# Patient Record
Sex: Female | Born: 1950 | Race: White | Hispanic: No | Smoking: Former smoker
Health system: Southern US, Community
[De-identification: ages and names within clinical notes are randomized; demographics above are authoritative.]

## PROBLEM LIST (undated history)

## (undated) DIAGNOSIS — F419 Anxiety disorder, unspecified: Secondary | ICD-10-CM

## (undated) DIAGNOSIS — E785 Hyperlipidemia, unspecified: Secondary | ICD-10-CM

## (undated) DIAGNOSIS — K219 Gastro-esophageal reflux disease without esophagitis: Secondary | ICD-10-CM

## (undated) HISTORY — DX: Hyperlipidemia, unspecified: E78.5

## (undated) HISTORY — DX: Anxiety disorder, unspecified: F41.9

## (undated) HISTORY — DX: Gastro-esophageal reflux disease without esophagitis: K21.9

## (undated) HISTORY — PX: CATARACT EXTRACTION: SUR2

## (undated) HISTORY — PX: ABDOMINAL HYSTERECTOMY: SHX81

## (undated) HISTORY — PX: BREAST BIOPSY: SHX20

## (undated) HISTORY — PX: OTHER SURGICAL HISTORY: SHX169

## (undated) HISTORY — PX: SHOULDER SURGERY: SHX246

---

## 2018-12-07 ENCOUNTER — Other Ambulatory Visit: Payer: Self-pay | Admitting: Nurse Practitioner

## 2018-12-07 DIAGNOSIS — Z1231 Encounter for screening mammogram for malignant neoplasm of breast: Secondary | ICD-10-CM

## 2019-01-22 ENCOUNTER — Ambulatory Visit
Admission: RE | Admit: 2019-01-22 | Discharge: 2019-01-22 | Disposition: A | Discharge status: Home / Self Care | Payer: Medicare PPO | Source: Ambulatory Visit | Attending: Nurse Practitioner | Admitting: Nurse Practitioner

## 2019-01-22 ENCOUNTER — Other Ambulatory Visit: Payer: Self-pay

## 2019-01-22 DIAGNOSIS — Z1231 Encounter for screening mammogram for malignant neoplasm of breast: Secondary | ICD-10-CM

## 2020-06-18 ENCOUNTER — Ambulatory Visit: Payer: Medicare PPO | Admitting: Neurology

## 2020-06-18 ENCOUNTER — Encounter: Payer: Self-pay | Admitting: Neurology

## 2020-06-18 VITALS — BP 142/84 | HR 86 | Ht 66.0 in | Wt 145.3 lb

## 2020-06-18 DIAGNOSIS — R251 Tremor, unspecified: Secondary | ICD-10-CM | POA: Diagnosis not present

## 2020-06-18 DIAGNOSIS — G250 Essential tremor: Secondary | ICD-10-CM

## 2020-06-18 DIAGNOSIS — R498 Other voice and resonance disorders: Secondary | ICD-10-CM

## 2020-06-18 DIAGNOSIS — R490 Dysphonia: Secondary | ICD-10-CM | POA: Diagnosis not present

## 2020-06-18 MED ORDER — PRIMIDONE 50 MG PO TABS: 100.0000 mg | ORAL_TABLET | Freq: Every day | ORAL | 3 refills | Status: DC

## 2020-06-18 NOTE — Patient Instructions (Addendum)
It was nice to meet you with Belenda Cruise today. 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  It can affect your head, neck, chin, voice, and hands.  It can get worse over time.   I do not see any signs or symptoms of parkinson's like disease or what we call parkinsonism.   Please remember, that any kind of tremor may be exacerbated by anxiety, anger, nervousness, excitement, dehydration, sleep deprivation, thyroid disease, by caffeine, and low blood sugar values or blood sugar fluctuations. Some medications, especially some antidepressants and lithium can cause or exacerbate tremors. Tremors may temporarily calm down or subside with the use of a benzodiazepine medication, such as Valium or related medications and with alcohol. Be aware, however, that drinking alcohol is not an approved or appropriate treatment for tremor control and long-term use of benzodiazepines such as Valium, lorazepam, alprazolam, or clonazepam can cause habit formation, physical and psychological addiction.   As discussed, for symptomatic treatment of your tremor, I suggest Mysoline (primidone) 50 mg strength: Take 1/2 pill each bedtime for 2 weeks, then 1 pill each bedtime for 2 weeks, then 1 1/2 pills each bedtime for 2 weeks, then 2 pills each bedtime thereafter. Common side effects reported are: Sleepiness, drowsiness, balance problems, confusion, and GI related symptoms.  Please send a prescription to your [facility] in [place].  If you are doing well on it, we can change it to your [facility] mail order pharmacy for long-term use.  Please follow-up in 3 to 4 months to see one of our nurse practitioners.  We will do one blood test today to check your thyroid function with the screening test called TSH.  We will call you with the result.

## 2020-06-18 NOTE — Progress Notes (Signed)
Subjective:    Patient ID: Candice Roth is a 70 y.o. female.  HPI     Huston FoleySaima Hazley Dezeeuw, MD, PhD [facility] [address], [address] [address] [place], [place] [id number]  Dear Candice NixonJanice,   I saw your patient, Candice MangoJannece Roth, upon your kind request, in my Neurologic clinic today for initial consultation of her tremor and voice changes, concern for parkinsonism.  The patient is accompanied by her younger daughter, [name], today.  As you know, Ms. Candice Roth is a 70 year old left-handed woman with an underlying medical history of hyperlipidemia, anxiety, reflux disease, and chronic back pain, who reports a several month history of tremors affecting her left more than right hand.  She has noticed a voice tremor in the recent months.  She feels that the tremor is more noticeable when she reaches for something or hold something.  She has noticed the tremor while she is holding onto the steering wheel but has not had any difficulty driving.  She is not good with directions but this is not a new problem.  She has had increase in anxiety.  She has been on sertraline in the recent past but tapered off of it on her own.  She thought the sertraline made her tremor worse.  She has a family history of tremors affecting both grandmothers, her maternal grandmother had a hand tremor and her paternal grandmother had a voice tremor.  No telltale family history of Parkinson's disease.  She does not remember that her mom had a tremor, she lived to be close to 72 years old.  Her father died younger at the age of 72 from cancer.  Father's brother also died younger from an accident.  Patient has 2 sisters, neither 1 has a tremor.  She has 3 children, an older daughter and a set of twins, son and daughter, neither 1 with tremors.  She is separated, [place] and lives with her younger daughter, [name] who is here today, [name]'s husband and their 4 boys.   Patient quit smoking some 3 to 4 years ago, she vapes nicotine.  She drinks alcohol very rarely.  She drinks diet Pepsi but it is caffeine free and no other caffeine generally speaking.  She does not drink a whole lot of water.  She has not fallen.  She has had some right hip pain lately.  She has low back pain issues as well.  She has had some changes in her gait per daughter, sometimes she looks like she is more hunched over.  Sometimes she walks with a shuffle.  She has had difficulty with her handwriting and can still present but has difficulty with cursive.    Her Past Medical History Is Significant For: Past Medical History:  Diagnosis Date  . Anxiety   . GERD (gastroesophageal reflux disease)   . Hyperlipidemia     Her Past Surgical History Is Significant For: Past Surgical History:  Procedure Laterality Date  . ABDOMINAL HYSTERECTOMY    . CATARACT EXTRACTION    . SHOULDER SURGERY Right   . yag treatment left eye      Her Family History Is Significant For: Family History  Problem Relation Age of Onset  . Heart attack Mother   . Heart attack Father   . Cancer Father        Oat cell     Her Social History Is Significant For: Social History   Socioeconomic History  .  Marital status: Legally Separated    Spouse name: Not on file  . Number of children: Not on file  . Years of education: Not on file  . Highest education level: Not on file  Occupational History  . Not on file  Tobacco Use  . Smoking status: Former Games developer  . Smokeless tobacco: Never Used  Substance and Sexual Activity  . Alcohol use: Yes    Comment: Rare  . Drug use: Never  . Sexual activity: Not on file  Other Topics Concern  . Not on file  Social History Narrative  . Not on file   Social Determinants of Health   Financial Resource Strain: Not on file  Food Insecurity: Not on file  Transportation Needs: Not on file  Physical Activity: Not on file  Stress: Not on file  Social Connections: Not  on file    Her Allergies Are:  Allergies  Allergen Reactions  . Shellfish Allergy     Other reaction(s): Shortness of breath and hives  :   Her Current Medications Are:  Outpatient Encounter Medications as of 06/18/2020  Medication Sig  . atorvastatin (LIPITOR) 20 MG tablet Take 20 mg by mouth daily.   No facility-administered encounter medications on file as of 06/18/2020.  :   Review of Systems:  Out of a complete 14 point review of systems, all are reviewed and negative with the exception of these symptoms as listed below:  Review of Systems  Neurological:       Here for consult on tremor and shaky voice. Pt reports hand tremors are mostly in the left (dominate) hand some in the right. Reports sx have became more pronounced lately.     Objective:  Neurological Exam  Physical Exam Physical Examination:   Vitals:   06/18/20 1449  BP: (!) 142/84  Pulse: 86  SpO2: 96%   General Examination: The patient is a very pleasant 70 y.o. female in no acute distress. She appears well-developed and well-nourished and well groomed.   HEENT: Normocephalic, atraumatic, pupils are equal, round and reactive to light and accommodation.  She is status post bilateral cataract repairs.  Hearing is grossly intact, extraocular tracking well preserved, no nystagmus.  Face is symmetric with no significant facial masking noted, speech is mildly dysphonic, she has a voice tremor.  She has an intermittent lower lip and chin tremor.  She has no significant nuchal rigidity and good range of motion in the neck, no carotid bruits.  Airway examination reveals moderate mouth dryness.  Tongue protrudes centrally and palate elevates symmetrically.   Chest: Clear to auscultation without wheezing, rhonchi or crackles noted.  Heart: S1+S2+0, regular and normal without murmurs, rubs or gallops noted.   Abdomen: Soft, non-tender and non-distended with normal bowel sounds appreciated on  auscultation.  Extremities: There is no pitting edema in the distal lower extremities bilaterally.  Skin: Warm and dry without trophic changes noted.  Musculoskeletal: exam reveals right hip discomfort, mild right knee swelling.    Neurologically:  Mental status: The patient is awake, alert and oriented in all 4 spheres. Her immediate and remote memory, attention, language skills and fund of knowledge are appropriate. There is no evidence of aphasia, agnosia, apraxia or anomia. Speech is clear with normal prosody and enunciation. Thought process is linear. Mood is normal and affect is normal.  Cranial nerves II - XII are as described above under HEENT exam. In addition: shoulder shrug is normal with equal shoulder height noted. Motor exam: Normal  bulk, strength and tone is noted. There is no drift, or rebound.  She has a slight and intermittent resting tremor in the left hand, it is a faster and irregular tremor.  She has a mild to moderate left upper extremity postural tremor, mild action tremor, mild postural tremor in the right upper extremity with mild action component, no significant intention tremor in either upper extremity.  On 06/18/2020: On Archimedes spiral drawing she has mild trembling with both hands, more noticeable with the left hand, handwriting with the left hand is tremulous, legible, not micrographic.  Romberg is not tested for safety concerns, reflexes are 1+ in the upper extremities and trace in the lower extremities. Fine motor skills and coordination: intact with normal finger taps, normal hand movements, normal rapid alternating patting, normal foot taps and normal foot agility.  No obvious decrement in amplitude, no lateralization noted.  Cerebellar testing: No dysmetria or intention tremor on finger to nose testing. Heel to shin is unremarkable bilaterally. There is no truncal or gait ataxia.  Sensory exam: intact to light touch in the upper and lower extremities.  Gait,  station and balance: She stands without difficulty, posture is age-appropriate.  She reports some discomfort in her right hip and initially has a slight limp on the right.  She walks with preserved arm swing, no obvious shuffling.  Assessment and Plan:   In summary, Jametta Guidroz is a very pleasant 70 y.o.-year old female with an underlying medical history of hyperlipidemia, anxiety, reflux disease, and chronic back pain, who presents for evaluation of her tremor disorder.  She has developed a hand tremor which is more pronounced in the left and a voice tremor, examination also shows a facial tremor as well as mild dysphonia.  She has no telltale signs of parkinsonism, history and examination are supportive of essential tremor.   I had a long discussion with the patient and her daughter, Belenda Cruise, regarding this diagnosis, its prognosis and potential symptomatic treatment options.  She had blood work through your office on 03/25/2020 which showed a benign CMP and benign lipid panel.  We will add a TSH today.  We talked about tremor triggers including caffeine, anxiety, stress, blood sugar fluctuations, dehydration, she does not drink a whole lot of caffeine but is advised to stay better hydrated with water.  She denies any significant anxiety currently but was on sertraline before.  First-line treatment options would be in the form of primidone or propranolol.  I would recommend a trial of primidone low-dose starting at 50 mg strength with half a pill at bedtime.  We talked about expectations, limitations and possible common side effects including sedation.  Of note, the patient reports that she does not sleep very well.  She has chronic difficulty initiating sleep.  She is advised that this could make her sleepy at night but she should also look out for problems with balance and drowsiness during the day, headaches and GI upset.  She is agreeable to starting this medication.  We will call with the TSH  result and plan a follow-up in this clinic in about 3 to 4 months with one of our nurse practitioners.  We will start with low-dose Mysoline at 50 mg strength half a pill at bedtime and increase in weekly increments to up to 100 mg at bedtime for now.  We can increase further and add a daytime dose if need be in the future.  Alternatively, we could consider propranolol down the road, we would  want to be mindful of exacerbation of depression potentially and also lower heart rate and pulse, lightheadedness and dizziness with the propranolol.   They are advised to call with any interim questions or concerns or email through MyChart.  I answered all their questions today and the patient and her daughter were in agreement.   Of note, her neurological exam is otherwise nonfocal and I do not see a pressing reason to proceed with the scan.  We can always consider brain MRI in the future.  Thank you very much for allowing me to participate in the care of this nice patient. If I can be of any further assistance to you please do not hesitate to call me at [phone].  Sincerely,   Huston Foley, MD, PhD

## 2020-06-19 LAB — TSH: TSH: 1.14 u[IU]/mL (ref 0.450–4.500)

## 2020-06-19 NOTE — Progress Notes (Signed)
Thyroid screening test called TSH was normal, please update pt or daughter.

## 2020-06-22 ENCOUNTER — Telehealth: Payer: Self-pay

## 2020-06-22 NOTE — Telephone Encounter (Signed)
Pt advised of results and verbalized understanding.  

## 2020-06-22 NOTE — Telephone Encounter (Signed)
-----   Message from Huston Foley, MD sent at 06/19/2020 10:45 AM EST ----- Thyroid screening test called TSH was normal, please update pt or daughter.

## 2020-09-14 ENCOUNTER — Other Ambulatory Visit: Payer: Self-pay | Admitting: Neurology

## 2020-09-14 DIAGNOSIS — R251 Tremor, unspecified: Secondary | ICD-10-CM

## 2020-09-14 DIAGNOSIS — R498 Other voice and resonance disorders: Secondary | ICD-10-CM

## 2020-09-14 DIAGNOSIS — G250 Essential tremor: Secondary | ICD-10-CM

## 2020-09-14 DIAGNOSIS — R490 Dysphonia: Secondary | ICD-10-CM

## 2020-09-28 ENCOUNTER — Other Ambulatory Visit: Payer: Self-pay

## 2020-09-28 ENCOUNTER — Ambulatory Visit: Payer: Medicare Other | Admitting: Family Medicine

## 2020-09-28 ENCOUNTER — Encounter: Payer: Self-pay | Admitting: Family Medicine

## 2020-09-28 VITALS — BP 156/78 | HR 97 | Ht 66.0 in | Wt 139.0 lb

## 2020-09-28 DIAGNOSIS — G250 Essential tremor: Secondary | ICD-10-CM

## 2020-09-28 DIAGNOSIS — R498 Other voice and resonance disorders: Secondary | ICD-10-CM | POA: Diagnosis not present

## 2020-09-28 MED ORDER — PROPRANOLOL HCL ER 60 MG PO CP24: 60.0000 mg | ORAL_CAPSULE | Freq: Every day | ORAL | 3 refills | Status: DC

## 2020-09-28 NOTE — Patient Instructions (Signed)
Below is our plan:  We will discontinue primidone as it has not been effective. I recommend weaning this medications as follows: take 1.5 tablets for the next 4-5 nights, then decrease to 1 tablet nightly for 4-5 nights, then half a tablet for 4-5 nights, then discontinue. After discontinuing primidone, start propranolol ER 60mg  every day. You can take it in the morning or evening. Please monitor for side effects as discussed and let me know if you have any trouble!  Please make sure you are staying well hydrated. I recommend 50-60 ounces daily. Well balanced diet and regular exercise encouraged. Consistent sleep schedule with 6-8 hours recommended.   Please continue follow up with care team as directed.   Follow up with me in 3-6 months.   You may receive a survey regarding today's visit. I encourage you to leave honest feed back as I do use this information to improve patient care. Thank you for seeing me today!

## 2020-09-28 NOTE — Progress Notes (Addendum)
Chief Complaint  Patient presents with   Follow-up    Rm 2, alone. Tremor f/u, pt states her tremors have been the same since starting primidone. Pt states some nights she feels a little off balance but goes away in a few minutes.      HISTORY OF PRESENT ILLNESS: 09/28/20 ALL:  Candice Roth is a 69 y.o. female here today for follow up for essential tremor. She was started on primidone at consult visit with Dr Frances Furbish. She was advised to take 25mg  at bedtime and titrate to total dose of 100mg  at bedtime, as tolerated. She has increased dose to 100mg  at bedtime without any significant improvement. She has very mild dizziness from time to time after taking it but otherwise tolerating well. She does not feel tremor has improved at all on this medication. Left hand worse than right. Activity worsens tremor. Voice tremor unchanged.    HISTORY (copied from Dr previous note)  Dear ,   I saw your patient, Candice Roth, upon your kind request, in my Neurologic clinic today for initial consultation of her tremor and voice changes, concern for parkinsonism.  The patient is accompanied by her younger daughter, Teofilo Pod, today.  As you know, Candice Roth is a 70 year old left-handed woman with an underlying medical history of hyperlipidemia, anxiety, reflux disease, and chronic back pain, who reports a several month history of tremors affecting her left more than right hand.  She has noticed a voice tremor in the recent months.  She feels that the tremor is more noticeable when she reaches for something or hold something.  She has noticed the tremor while she is holding onto the steering wheel but has not had any difficulty driving.  She is not good with directions but this is not a new problem.  She has had increase in anxiety.  She has been on sertraline in the recent past but tapered off of it on her own.  She thought the sertraline made her tremor worse.  She has a family history of  tremors affecting both grandmothers, her maternal grandmother had a hand tremor and her paternal grandmother had a voice tremor.  No telltale family history of Parkinson's disease.  She does not remember that her mom had a tremor, she lived to be close to 48 years old.  Her father died younger at the age of 94 from cancer.  Father's brother also died younger from an accident.  Patient has 2 sisters, neither 1 has a tremor.  She has 3 children, an older daughter and a set of twins, son and daughter, neither 1 with tremors.  She is separated, she moved to [place] in October 2019 and lives with her younger daughter, [place] who is here today, [name]'s husband and their 4 boys.  Patient quit smoking some 3 to 4 years ago, she vapes nicotine.  She drinks alcohol very rarely.  She drinks diet Pepsi but it is caffeine free and no other caffeine generally speaking.  She does not drink a whole lot of water.  She has not fallen.  She has had some right hip pain lately.  She has low back pain issues as well.  She has had some changes in her gait per daughter, sometimes she looks like she is more hunched over.  Sometimes she walks with a shuffle.  She has had difficulty with her handwriting and can still present but has difficulty with cursive.     REVIEW OF  SYSTEMS: Out of a complete 14 system review of symptoms, the patient complains only of the following symptoms, hand and voice tremor and all other reviewed systems are negative.    ALLERGIES: Allergies  Allergen Reactions   Shellfish Allergy     Other reaction(s): Shortness of breath and hives     HOME MEDICATIONS: Outpatient Medications Prior to Visit  Medication Sig Dispense Refill   atorvastatin (LIPITOR) 20 MG tablet Take 20 mg by mouth daily.     primidone (MYSOLINE) 50 MG tablet TAKE 2 TABLETS BY MOUTH AT BEDTIME. FOLLOW TITRATION INSTRUCTIONS PROVIDED SEPARATELY. 180 tablet 1   No facility-administered medications prior to  visit.     PAST MEDICAL HISTORY: Past Medical History:  Diagnosis Date   Anxiety    GERD (gastroesophageal reflux disease)    Hyperlipidemia      PAST SURGICAL HISTORY: Past Surgical History:  Procedure Laterality Date   ABDOMINAL HYSTERECTOMY     CATARACT EXTRACTION     SHOULDER SURGERY Right    yag treatment left eye       FAMILY HISTORY: Family History  Problem Relation Age of Onset   Heart attack Mother    Heart attack Father    Cancer Father        Oat cell      SOCIAL HISTORY: Social History   Socioeconomic History   Marital status: Legally Separated    Spouse name: Not on file   Number of children: Not on file   Years of education: Not on file   Highest education level: Not on file  Occupational History   Not on file  Tobacco Use   Smoking status: Former    Pack years: 0.00   Smokeless tobacco: Never  Substance and Sexual Activity   Alcohol use: Yes    Comment: Rare   Drug use: Never   Sexual activity: Not on file  Other Topics Concern   Not on file  Social History Narrative   Not on file   Social Determinants of Health   Financial Resource Strain: Not on file  Food Insecurity: Not on file  Transportation Needs: Not on file  Physical Activity: Not on file  Stress: Not on file  Social Connections: Not on file  Intimate Partner Violence: Not on file      PHYSICAL EXAM  Vitals:   09/28/20 1417  BP: (!) 156/78  Pulse: 97  Weight: 139 lb (63 kg)  Height: 5\' 6"  (1.676 m)   Body mass index is 22.44 kg/m.   Generalized: Well developed, in no acute distress  Cardiology: normal rate and rhythm, no murmur auscultated  Respiratory: clear to auscultation bilaterally    Neurological examination  Mentation: Alert oriented to time, place, history taking. Follows all commands speech and language fluent, mild dysphonia  Cranial nerve II-XII: Pupils were equal round reactive to light. Extraocular movements were full, visual field were  full on confrontational test. Facial sensation and strength were normal.Head turning and shoulder shrug  were normal and symmetric. Motor: The motor testing reveals 5 over 5 strength of all 4 extremities. Good symmetric motor tone is noted throughout. Left hand> right hand tremor. Left hand with tremor at rest but significant worsening with action. No resting tremor noted on right.  Gait and station: Gait is normal.     DIAGNOSTIC DATA (LABS, IMAGING, TESTING) - I reviewed patient records, labs, notes, testing and imaging myself where available.  No results found for: WBC, HGB, HCT, MCV, PLT  No results found for: NA, K, CL, CO2, GLUCOSE, BUN, CREATININE, CALCIUM, PROT, ALBUMIN, AST, ALT, ALKPHOS, BILITOT, GFRNONAA, GFRAA No results found for: CHOL, HDL, LDLCALC, LDLDIRECT, TRIG, CHOLHDL No results found for: WYOV7C No results found for: VITAMINB12 Lab Results  Component Value Date   TSH 1.140 06/18/2020    No flowsheet data found.   No flowsheet data found.   ASSESSMENT AND PLAN  70 y.o. year old female  has a past medical history of Anxiety, GERD (gastroesophageal reflux disease), and Hyperlipidemia. here with     Essential tremor  Voice tremor  Candice Roth has not noted any improvement in hand or voice tremor on primidone 100mg  daily. She wishes to try propranolol instead. We have reviewed possible side effects with propranolol including need for BP and pulse monitoring, worsening depression, fatigue, changes in heart rate or changes in breathing. She will monitor closely and let me know if she has any concerns. I will have her wean primidone by 1/2 tablet every 4-5 days prior to starting propranolol. Once primidone is discontinued, she will take propranolol ER 60mg  daily. She will continue healthy lifestyle habits and stay well hydrated. May consider referral to [facility] for consideration of DBS if no improvement with propranolol. She was given educational materials on tremor and  propranolol in [facility]. She will follow up in 3-6 months, sooner if needed. She verbalizes understanding and agreement with this plan.   No orders of the defined types were placed in this encounter.    Meds ordered this encounter  Medications   propranolol ER (INDERAL LA) 60 MG 24 hr capsule    Sig: Take 1 capsule (60 mg total) by mouth daily.    Dispense:  90 capsule    Refill:  3    Order Specific Question:   Supervising Provider    Answer:   [facility], MSN, FNP-C 09/28/2020, 2:58 PM  [facility] [address], [address] [place], [address] [place] [phone]  I reviewed the above note and documentation by the Nurse Practitioner and agree with the history, exam, assessment and plan as outlined above. I was available for consultation. [id number], MD, PhD [facility])

## 2020-11-19 HISTORY — PX: KNEE ARTHROSCOPY: SUR90

## 2021-03-29 ENCOUNTER — Encounter: Payer: Self-pay | Admitting: Family Medicine

## 2021-03-29 ENCOUNTER — Ambulatory Visit: Payer: Medicare Other | Admitting: Family Medicine

## 2021-03-29 VITALS — BP 120/78 | HR 64 | Ht 65.0 in | Wt 138.0 lb

## 2021-03-29 DIAGNOSIS — R498 Other voice and resonance disorders: Secondary | ICD-10-CM | POA: Diagnosis not present

## 2021-03-29 DIAGNOSIS — G250 Essential tremor: Secondary | ICD-10-CM

## 2021-03-29 MED ORDER — PROPRANOLOL HCL ER 80 MG PO CP24: 80.0000 mg | ORAL_CAPSULE | Freq: Every day | ORAL | 3 refills | Status: DC

## 2021-03-29 NOTE — Progress Notes (Signed)
Chief Complaint  Patient presents with   Follow-up    Pt alone, r, 16. Overall states that things are stable/about the same. No issues/concerns    HISTORY OF PRESENT ILLNESS: 03/29/21 ALL:  Candice Roth for follow up for ET. We switched her from primidone to propranolol at last visit 09/2020. She reports that tremor has improved. She is not able to sign her name and feels it is legible. She continues to have left > right had tremor. Voice is stable, maybe slightly better. She has had more jerking tremors of jaw. She is tolerating propranolol well.   09/28/2020 ALL: Candice Roth is a 70 y.o. female here today for follow up for essential tremor. She was started on primidone at consult visit with Dr Frances Furbish. She was advised to take 25mg  at bedtime and titrate to total dose of 100mg  at bedtime, as tolerated. She has increased dose to 100mg  at bedtime without any significant improvement. She has very mild dizziness from time to time after taking it but otherwise tolerating well. She does not feel tremor has improved at all on this medication. Left hand worse than right. Activity worsens tremor. Voice tremor unchanged.    HISTORY (copied from Dr previous note)  Dear ,   I saw your patient, Candice Roth, upon your kind request, in my Neurologic clinic today for initial consultation of her tremor and voice changes, concern for parkinsonism.  The patient is accompanied by her younger daughter, Candice Roth, today.  As you know, Candice Roth is a 71 year old left-handed woman with an underlying medical history of hyperlipidemia, anxiety, reflux disease, and chronic back pain, who reports a several month history of tremors affecting her left more than right hand.  She has noticed a voice tremor in the recent months.  She feels that the tremor is more noticeable when she reaches for something or hold something.  She has noticed the tremor while she is holding onto the steering wheel  but has not had any difficulty driving.  She is not good with directions but this is not a new problem.  She has had increase in anxiety.  She has been on sertraline in the recent past but tapered off of it on her own.  She thought the sertraline made her tremor worse.  She has a family history of tremors affecting both grandmothers, her maternal grandmother had a hand tremor and her paternal grandmother had a voice tremor.  No telltale family history of Parkinson's disease.  She does not remember that her mom had a tremor, she lived to be close to 20 years old.  Her father died younger at the age of 32 from cancer.  Father's brother also died younger from an accident.  Patient has 2 sisters, neither 1 has a tremor.  She has 3 children, an older daughter and a set of twins, son and daughter, neither 1 with tremors.  She is separated, she moved to [place] from [place] in October 2019 and lives with her younger daughter, Candice Roth who is here today, Candice Roth's husband and their 4 boys.  Patient quit smoking some 3 to 4 years ago, she vapes nicotine.  She drinks alcohol very rarely.  She drinks diet Pepsi but it is caffeine free and no other caffeine generally speaking.  She does not drink a whole lot of water.  She has not fallen.  She has had some right hip pain lately.  She has low back pain issues as well.  She  has had some changes in her gait per daughter, sometimes she looks like she is more hunched over.  Sometimes she walks with a shuffle.  She has had difficulty with her handwriting and can still present but has difficulty with cursive.     REVIEW OF SYSTEMS: Out of a complete 14 system review of symptoms, the patient complains only of the following symptoms, hand and voice tremor and all other reviewed systems are negative.    ALLERGIES: Allergies  Allergen Reactions   Shellfish Allergy     Other reaction(s): Shortness of breath and hives     HOME MEDICATIONS: Outpatient Medications Prior  to Visit  Medication Sig Dispense Refill   atorvastatin (LIPITOR) 20 MG tablet Take 20 mg by mouth daily.     lisinopril (ZESTRIL) 5 MG tablet Take 5 mg by mouth daily.     propranolol ER (INDERAL LA) 60 MG 24 hr capsule Take 1 capsule (60 mg total) by mouth daily. 90 capsule 3   No facility-administered medications prior to visit.     PAST MEDICAL HISTORY: Past Medical History:  Diagnosis Date   Anxiety    GERD (gastroesophageal reflux disease)    Hyperlipidemia      PAST SURGICAL HISTORY: Past Surgical History:  Procedure Laterality Date   ABDOMINAL HYSTERECTOMY     CATARACT EXTRACTION     KNEE ARTHROSCOPY Right 11/19/2020   SHOULDER SURGERY Right    yag treatment left eye       FAMILY HISTORY: Family History  Problem Relation Age of Onset   Heart attack Mother    Heart attack Father    Cancer Father        Oat cell      SOCIAL HISTORY: Social History   Socioeconomic History   Marital status: Legally Separated    Spouse name: Not on file   Number of children: Not on file   Years of education: Not on file   Highest education level: Not on file  Occupational History   Not on file  Tobacco Use   Smoking status: Former   Smokeless tobacco: Never  Substance and Sexual Activity   Alcohol use: Yes    Comment: Rare   Drug use: Never   Sexual activity: Not on file  Other Topics Concern   Not on file  Social History Narrative   Not on file   Social Determinants of Health   Financial Resource Strain: Not on file  Food Insecurity: Not on file  Transportation Needs: Not on file  Physical Activity: Not on file  Stress: Not on file  Social Connections: Not on file  Intimate Partner Violence: Not on file    PHYSICAL EXAM  Vitals:   03/29/21 1434  BP: 120/78  Pulse: 64  Weight: 138 lb (62.6 kg)  Height: 5\' 5"  (1.651 m)    Body mass index is 22.96 kg/m.   Generalized: Well developed, in no acute distress  Cardiology: normal rate and rhythm,  no murmur auscultated  Respiratory: clear to auscultation bilaterally    Neurological examination  Mentation: Alert oriented to time, place, history taking. Follows all commands speech and language fluent, mild dysphonia  Cranial nerve II-XII: Pupils were equal round reactive to light. Extraocular movements were full, visual field were full on confrontational test. Facial sensation and strength were normal.Head turning and shoulder shrug  were normal and symmetric. Motor: The motor testing reveals 5 over 5 strength of all 4 extremities. Good symmetric motor tone is noted  throughout. Left hand> right hand tremor. Left hand with tremor at rest but significant worsening with action. No resting tremor noted on right.  Gait and station: Gait is normal.     DIAGNOSTIC DATA (LABS, IMAGING, TESTING) - I reviewed patient records, labs, notes, testing and imaging myself where available.  No results found for: WBC, HGB, HCT, MCV, PLT No results found for: NA, K, CL, CO2, GLUCOSE, BUN, CREATININE, CALCIUM, PROT, ALBUMIN, AST, ALT, ALKPHOS, BILITOT, GFRNONAA, GFRAA No results found for: CHOL, HDL, LDLCALC, LDLDIRECT, TRIG, CHOLHDL No results found for: ZOXW9U No results found for: VITAMINB12 Lab Results  Component Value Date   TSH 1.140 06/18/2020    No flowsheet data found.   No flowsheet data found.   ASSESSMENT AND PLAN  70 y.o. year old female  has a past medical history of Anxiety, GERD (gastroesophageal reflux disease), and Hyperlipidemia. here with     Essential tremor  Voice tremor  Candice Roth has had improvement of tremor on propranolol. She has noticed more jerking tremors of jaw over the past few weeks. We will increase propranolol to 80mg  daily. We have reviewed possible side effects with propranolol including need for BP and pulse monitoring, worsening depression, fatigue, changes in heart rate or changes in breathing. She will monitor closely and let me know if she has any  concerns. May consider referral to [facility] for consideration of DBS if no improvement with propranolol. She was given educational materials on tremor and propranolol in AVS. She will follow up in 6 months, sooner if needed. She verbalizes understanding and agreement with this plan.   No orders of the defined types were placed in this encounter.    Meds ordered this encounter  Medications   propranolol ER (INDERAL LA) 80 MG 24 hr capsule    Sig: Take 1 capsule (80 mg total) by mouth daily.    Dispense:  90 capsule    Refill:  3    Order Specific Question:   Supervising Provider    Answer:   [facility] Anson Fret     [[id number]], MSN, FNP-C 03/29/2021, 3:03 PM  [facility] [address], [address] [place], [place] [phone]

## 2021-03-29 NOTE — Patient Instructions (Signed)
Below is our plan:  We will increase propranolol to 80mg  daily. Please discontinue 60mg  dosing. Keep a close eye on your BP and pulse. If you have any unwanted side effects please let me know immediately.   Please make sure you are staying well hydrated. I recommend 50-60 ounces daily. Well balanced diet and regular exercise encouraged. Consistent sleep schedule with 6-8 hours recommended.   Please continue follow up with care team as directed.   Follow up with me in 6 - 12 months   You may receive a survey regarding today's visit. I encourage you to leave honest feed back as I do use this information to improve patient care. Thank you for seeing me today!

## 2021-06-03 ENCOUNTER — Encounter: Payer: Self-pay | Admitting: Podiatry

## 2021-06-03 ENCOUNTER — Other Ambulatory Visit: Payer: Self-pay

## 2021-06-03 ENCOUNTER — Ambulatory Visit: Payer: Medicare Other | Admitting: Podiatry

## 2021-06-03 DIAGNOSIS — G5762 Lesion of plantar nerve, left lower limb: Secondary | ICD-10-CM

## 2021-06-03 DIAGNOSIS — B351 Tinea unguium: Secondary | ICD-10-CM | POA: Diagnosis not present

## 2021-06-03 DIAGNOSIS — G5761 Lesion of plantar nerve, right lower limb: Secondary | ICD-10-CM | POA: Diagnosis not present

## 2021-06-03 DIAGNOSIS — G5763 Lesion of plantar nerve, bilateral lower limbs: Secondary | ICD-10-CM | POA: Diagnosis not present

## 2021-06-03 MED ORDER — CICLOPIROX 8 % EX SOLN
Freq: Every day | CUTANEOUS | 0 refills | Status: DC
Start: 2021-06-03 — End: 2021-11-29

## 2021-06-03 MED ORDER — DEXAMETHASONE SODIUM PHOSPHATE 120 MG/30ML IJ SOLN
8.0000 mg | Freq: Once | INTRAMUSCULAR | Status: AC
  Administered 2021-06-03: 8 mg via INTRA_ARTICULAR

## 2021-06-03 NOTE — Progress Notes (Signed)
°  Subjective:  Patient ID: Candice Roth, female    DOB: June 10, 1950,   MRN: [id number]  Chief Complaint  Patient presents with   nerve     having nerve problem and L nail fungus great toe    71 y.o. female presents for concern for left great toe fungus and nerve pain in bilateral 3rd and 4th toes that has been going on for years. Relates it depends on what shoes she wears but relates numbness and tingling in the toes. It is not constant. No other treatments.   . Denies any other pedal complaints. Denies n/v/f/c.   Past Medical History:  Diagnosis Date   Anxiety    GERD (gastroesophageal reflux disease)    Hyperlipidemia     Objective:  Physical Exam: Vascular: DP/PT pulses 2/4 bilateral. CFT <3 seconds. Normal hair growth on digits. No edema.  Skin. No lacerations or abrasions bilateral feet. Left hallux distal nail white thickened and brittle.  Musculoskeletal: MMT 5/5 bilateral lower extremities in DF, PF, Inversion and Eversion. Deceased ROM in DF of ankle joint. Tender with forefoot squeeze and in third interpace of bilateral feet. Positive mulders click.  Neurological: Sensation intact to light touch.   Assessment:   1. Morton's neuroma of left foot   2. 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 Onychomycosis      Plan:  Patient was evaluated and treated and all questions answered. Discussed neuroma and treatment options with patient.  Refused radiographs today.  Injection offered today. Patient in agreement. Procedure below.  Discussed padding and offloading today.  Has tried meloxicam in past with no relief.  Discussed if pain does not improve may consider  MRI for further surgical planning.  Patient to return in 6 weeks or sooner if concerns arise.   Procedure: Injection Tendon/Ligament Discussed alternatives, risks, complications and verbal consent was obtained.  Location: Bilateral third interspace . Skin Prep: Alcohol. Injectate: 1cc 0.5%  marcaine plain, 1 cc dexamethasone.  Disposition: Patient tolerated procedure well. Injection site dressed with a band-aid.  Post-injection care was discussed and return precautions discussed.    Louann Sjogren, DPM

## 2021-07-16 ENCOUNTER — Other Ambulatory Visit: Payer: Self-pay | Admitting: Nurse Practitioner

## 2021-07-16 ENCOUNTER — Ambulatory Visit: Payer: Medicare Other | Admitting: Podiatry

## 2021-07-16 ENCOUNTER — Encounter: Payer: Self-pay | Admitting: Podiatry

## 2021-07-16 DIAGNOSIS — G5762 Lesion of plantar nerve, left lower limb: Secondary | ICD-10-CM

## 2021-07-16 DIAGNOSIS — B351 Tinea unguium: Secondary | ICD-10-CM

## 2021-07-16 DIAGNOSIS — Z1231 Encounter for screening mammogram for malignant neoplasm of breast: Secondary | ICD-10-CM

## 2021-07-16 DIAGNOSIS — G5761 Lesion of plantar nerve, right lower limb: Secondary | ICD-10-CM

## 2021-07-16 NOTE — Progress Notes (Signed)
?  Subjective:  ?Patient ID: Candice Roth, female    DOB: 1951-03-03,   MRN: [id number] ? ?Chief Complaint  ?Patient presents with  ? Neuroma  ?   b/l neuroma . ?  ? ? ?71 y.o. female presents for follow-up of bilateral neuromas. Relates the right is doing better but the left is still painful mostly at night. Also relates she has not noticed a difference with the penlac yet. .   . Denies any other pedal complaints. Denies n/v/f/c.  ? ?Past Medical History:  ?Diagnosis Date  ? Anxiety   ? GERD (gastroesophageal reflux disease)   ? Hyperlipidemia   ? ? ?Objective:  ?Physical Exam: ?Vascular: DP/PT pulses 2/4 bilateral. CFT <3 seconds. Normal hair growth on digits. No edema.  ?Skin. No lacerations or abrasions bilateral feet. Left hallux distal nail white thickened and brittle.  ?Musculoskeletal: MMT 5/5 bilateral lower extremities in DF, PF, Inversion and Eversion. Deceased ROM in DF of ankle joint. Tender with forefoot squeeze and in third interpace of bilateral feet. Improved on right.  Positive mulders click.  ?Neurological: Sensation intact to light touch.  ? ?Assessment:  ? ?1. Morton's neuroma of left foot   ?2. 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 Onychomycosis   ? ? ? ? ?Plan:  ?Patient was evaluated and treated and all questions answered. ?Discussed neuroma and treatment options with patient.  ?Refused radiographs today.  ?Injection offered today. Defers injection today.  ?Discussed padding and offloading today.  ?Has tried meloxicam in past with no relief.  ?Discussed if pain does not improve may consider  MRI for further surgical planning.  ?Continue with penlac for fungal nails ?Patient to return as needed.  ? ? ?Louann Sjogren, DPM  ? ? ?

## 2021-08-26 ENCOUNTER — Ambulatory Visit (INDEPENDENT_AMBULATORY_CARE_PROVIDER_SITE_OTHER): Payer: Medicare Other

## 2021-08-26 DIAGNOSIS — Z1231 Encounter for screening mammogram for malignant neoplasm of breast: Secondary | ICD-10-CM

## 2021-08-27 ENCOUNTER — Other Ambulatory Visit: Payer: Self-pay | Admitting: Nurse Practitioner

## 2021-08-27 DIAGNOSIS — R928 Other abnormal and inconclusive findings on diagnostic imaging of breast: Secondary | ICD-10-CM

## 2021-09-09 ENCOUNTER — Ambulatory Visit
Admission: RE | Admit: 2021-09-09 | Discharge: 2021-09-09 | Disposition: A | Discharge status: Home / Self Care | Payer: Medicare Other | Source: Ambulatory Visit | Attending: Nurse Practitioner | Admitting: Nurse Practitioner

## 2021-09-09 ENCOUNTER — Other Ambulatory Visit: Payer: Self-pay | Admitting: Nurse Practitioner

## 2021-09-09 DIAGNOSIS — R928 Other abnormal and inconclusive findings on diagnostic imaging of breast: Secondary | ICD-10-CM

## 2021-09-09 DIAGNOSIS — N6310 Unspecified lump in the right breast, unspecified quadrant: Secondary | ICD-10-CM

## 2021-09-21 ENCOUNTER — Ambulatory Visit
Admission: RE | Admit: 2021-09-21 | Discharge: 2021-09-21 | Disposition: A | Discharge status: Home / Self Care | Payer: Medicare Other | Source: Ambulatory Visit | Attending: Nurse Practitioner | Admitting: Nurse Practitioner

## 2021-09-21 DIAGNOSIS — N6310 Unspecified lump in the right breast, unspecified quadrant: Secondary | ICD-10-CM

## 2021-11-25 NOTE — Progress Notes (Signed)
Chief Complaint  Patient presents with   Follow-up    Rm 16, alone. Here to f/u for tremors. Per pt no changes in sx since last ov.     HISTORY OF PRESENT ILLNESS:  11/29/21 ALL:  Candice Roth returns for follow up for ET. We increased propranolol to 80mg  daily at last visit 03/2021. She has not noted any improvement in tremor since. She is tolerating well. She continues to note left > right hand, voice and jaw tremor. Worse when writing or concentrating.   03/29/2021 ALL: Candice Roth returns for follow up for ET. We switched her from primidone to propranolol at last visit 09/2020. She reports that tremor has improved. She is not able to sign her name and feels it is legible. She continues to have left > right had tremor. Voice is stable, maybe slightly better. She has had more jerking tremors of jaw. She is tolerating propranolol well.   09/28/2020 ALL: Candice Roth is a 71 y.o. female here today for follow up for essential tremor. She was started on primidone at consult visit with Dr 61. She was advised to take 25mg  at bedtime and titrate to total dose of 100mg  at bedtime, as tolerated. She has increased dose to 100mg  at bedtime without any significant improvement. She has very mild dizziness from time to time after taking it but otherwise tolerating well. She does not feel tremor has improved at all on this medication. Left hand worse than right. Activity worsens tremor. Voice tremor unchanged.    HISTORY (copied from Dr Frances Furbish previous note)  Dear ,   I saw your patient, Candice Roth, upon your kind request, in my Neurologic clinic today for initial consultation of her tremor and voice changes, concern for parkinsonism.  The patient is accompanied by her younger daughter, , today.  As you know, [name] is a 71 year old left-handed woman with an underlying medical history of hyperlipidemia, anxiety, reflux disease, and chronic back pain, who reports a several  month history of tremors affecting her left more than right hand.  She has noticed a voice tremor in the recent months.  She feels that the tremor is more noticeable when she reaches for something or hold something.  She has noticed the tremor while she is holding onto the steering wheel but has not had any difficulty driving.  She is not good with directions but this is not a new problem.  She has had increase in anxiety.  She has been on sertraline in the recent past but tapered off of it on her own.  She thought the sertraline made her tremor worse.  She has a family history of tremors affecting both grandmothers, her maternal grandmother had a hand tremor and her paternal grandmother had a voice tremor.  No telltale family history of Parkinson's disease.  She does not remember that her mom had a tremor, she lived to be close to 61 years old.  Her father died younger at the age of 70 from cancer.  Father's brother also died younger from an accident.  Patient has 2 sisters, neither 1 has a tremor.  She has 3 children, an older daughter and a set of twins, son and daughter, neither 1 with tremors.  She is separated, she moved to [name] from [address] in October 2019 and lives with her younger daughter, 57 who is here today, [name]'s husband and their 4 boys.  Patient quit smoking some 3 to 4 years ago, she vapes nicotine.  She drinks alcohol very rarely.  She drinks diet Pepsi but it is caffeine free and no other caffeine generally speaking.  She does not drink a whole lot of water.  She has not fallen.  She has had some right hip pain lately.  She has low back pain issues as well.  She has had some changes in her gait per daughter, sometimes she looks like she is more hunched over.  Sometimes she walks with a shuffle.  She has had difficulty with her handwriting and can still present but has difficulty with cursive.     REVIEW OF SYSTEMS: Out of a complete 14 system review of symptoms, the patient  complains only of the following symptoms, hand and voice tremor and all other reviewed systems are negative.   ALLERGIES: Allergies  Allergen Reactions   Shellfish Allergy     Other reaction(s): Shortness of breath and hives     HOME MEDICATIONS: Outpatient Medications Prior to Visit  Medication Sig Dispense Refill   atorvastatin (LIPITOR) 20 MG tablet Take 20 mg by mouth daily.     lisinopril (ZESTRIL) 5 MG tablet Take 5 mg by mouth daily.     propranolol ER (INDERAL LA) 80 MG 24 hr capsule Take 1 capsule (80 mg total) by mouth daily. 90 capsule 3   ciclopirox (PENLAC) 8 % solution Apply topically at bedtime. Apply over nail and surrounding skin. Apply daily over previous coat. After seven (7) days, may remove with alcohol and continue cycle. 6.6 mL 0   No facility-administered medications prior to visit.     PAST MEDICAL HISTORY: Past Medical History:  Diagnosis Date   Anxiety    GERD (gastroesophageal reflux disease)    Hyperlipidemia      PAST SURGICAL HISTORY: Past Surgical History:  Procedure Laterality Date   ABDOMINAL HYSTERECTOMY     CATARACT EXTRACTION     KNEE ARTHROSCOPY Right 11/19/2020   SHOULDER SURGERY Right    yag treatment left eye       FAMILY HISTORY: Family History  Problem Relation Age of Onset   Heart attack Mother    Heart attack Father    Cancer Father        Oat cell      SOCIAL HISTORY: Social History   Socioeconomic History   Marital status: Legally Separated    Spouse name: Not on file   Number of children: Not on file   Years of education: Not on file   Highest education level: Not on file  Occupational History   Not on file  Tobacco Use   Smoking status: Former   Smokeless tobacco: Never  Substance and Sexual Activity   Alcohol use: Yes    Comment: Rare   Drug use: Never   Sexual activity: Not on file  Other Topics Concern   Not on file  Social History Narrative   Not on file   Social Determinants of Health    Financial Resource Strain: Not on file  Food Insecurity: Not on file  Transportation Needs: Not on file  Physical Activity: Not on file  Stress: Not on file  Social Connections: Not on file  Intimate Partner Violence: Not on file    PHYSICAL EXAM  Vitals:   11/29/21 1046  BP: 124/79  Pulse: 65  Weight: 137 lb 12.8 oz (62.5 kg)  Height: 5\' 5"  (1.651 m)     Body mass index is 22.93 kg/m.   Generalized: Well developed, in no acute distress  Cardiology: normal rate and rhythm, no murmur auscultated  Respiratory: clear to auscultation bilaterally    Neurological examination  Mentation: Alert oriented to time, place, history taking. Follows all commands speech and language fluent, mild dysphonia  Cranial nerve II-XII: Pupils were equal round reactive to light. Extraocular movements were full, visual field were full on confrontational test. Facial sensation and strength were normal.Head turning and shoulder shrug  were normal and symmetric. Motor: The motor testing reveals 5 over 5 strength of all 4 extremities. Good symmetric motor tone is noted throughout. Left hand> right hand tremor. Left hand with tremor at rest but significant worsening with action. No resting tremor noted on right.  Gait and station: Gait is normal.     DIAGNOSTIC DATA (LABS, IMAGING, TESTING) - I reviewed patient records, labs, notes, testing and imaging myself where available.  No results found for: "WBC", "HGB", "HCT", "MCV", "PLT" No results found for: "NA", "K", "CL", "CO2", "GLUCOSE", "BUN", "CREATININE", "CALCIUM", "PROT", "ALBUMIN", "AST", "ALT", "ALKPHOS", "BILITOT", "GFRNONAA", "GFRAA" No results found for: "CHOL", "HDL", "LDLCALC", "LDLDIRECT", "TRIG", "CHOLHDL" No results found for: "HGBA1C" No results found for: "VITAMINB12" Lab Results  Component Value Date   TSH 1.140 06/18/2020        No data to display               No data to display           ASSESSMENT AND  PLAN  71 y.o. year old female  has a past medical history of Anxiety, GERD (gastroesophageal reflux disease), and Hyperlipidemia. here with     Essential tremor  Voice tremor  Candice Roth has had improvement of tremor on propranolol. She has noticed more jerking tremors of jaw over the past few weeks. We will continue propranolol LA 80mg  daily. I will add topiramate 50mg  daily. She will start 25mg  daily for 2-3 weeks then increase dose to 50mg  daily. We have reviewed possible side effects of topiramate. May consider referral to [facility] for consideration of DBS if no improvement. She was given educational materials on tremor and propranolol in AVS. She will follow up in 6 months, sooner if needed. She verbalizes understanding and agreement with this plan.   No orders of the defined types were placed in this encounter.     Meds ordered this encounter  Medications   topiramate (TOPAMAX) 50 MG tablet    Sig: Take 1 tablet (50 mg total) by mouth 2 (two) times daily.    Dispense:  90 tablet    Refill:  1    Order Specific Question:   Supervising Provider    Answer:    , MSN, FNP-C 11/29/2021, 12:17 PM  [facility] [address] [place], [[id number]]     LZJ QBHAL 12/01/2021 [phone]

## 2021-11-25 NOTE — Patient Instructions (Signed)
Below is our plan:  We will continue propranolol LA 80mg  daily. Add topiramate 25mg  (1/2 tablet) daily for 2-3 weeks. If well tolerated increase dose to 50mg  daily. Monitor for about 4 weeks. If doing well and you note benefit with tremor, we can increase dose to 100mg  daily. We can wean propranolol in the future if you wish.   Please make sure you are staying well hydrated. I recommend 50-60 ounces daily. Well balanced diet and regular exercise encouraged. Consistent sleep schedule with 6-8 hours recommended.   Please continue follow up with care team as directed.   Follow up with me in 6 months   You may receive a survey regarding today's visit. I encourage you to leave honest feed back as I do use this information to improve patient care. Thank you for seeing me today!

## 2021-11-29 ENCOUNTER — Ambulatory Visit: Payer: Medicare Other | Admitting: Family Medicine

## 2021-11-29 ENCOUNTER — Encounter: Payer: Self-pay | Admitting: Family Medicine

## 2021-11-29 VITALS — BP 124/79 | HR 65 | Ht 65.0 in | Wt 137.8 lb

## 2021-11-29 DIAGNOSIS — G250 Essential tremor: Secondary | ICD-10-CM | POA: Diagnosis not present

## 2021-11-29 DIAGNOSIS — R498 Other voice and resonance disorders: Secondary | ICD-10-CM

## 2021-11-29 MED ORDER — TOPIRAMATE 50 MG PO TABS: 50.0000 mg | ORAL_TABLET | Freq: Two times a day (BID) | ORAL | 1 refills | Status: DC

## 2022-03-28 ENCOUNTER — Other Ambulatory Visit: Payer: Self-pay

## 2022-03-28 MED ORDER — PROPRANOLOL HCL ER 80 MG PO CP24: 80.0000 mg | ORAL_CAPSULE | Freq: Every day | ORAL | 3 refills | Status: DC

## 2022-06-09 NOTE — Patient Instructions (Signed)
Below is our plan:  We will continue propranolol LA '80mg'$  daily. We will add gabapentin '100mg'$  three times daily. Start '100mg'$  daily for 1 week then increase dose to '100mg'$  twice daily for 1 week then increase to '100mg'$  three times daily.   Consider referral to [facility] as discussed.   Please make sure you are staying well hydrated. I recommend 50-60 ounces daily. Well balanced diet and regular exercise encouraged. Consistent sleep schedule with 6-8 hours recommended.   Please continue follow up with care team as directed.   Follow up with me in 6 months   You may receive a survey regarding today's visit. I encourage you to leave honest feed back as I do use this information to improve patient care. Thank you for seeing me today!

## 2022-06-09 NOTE — Progress Notes (Signed)
Chief Complaint  Patient presents with   Follow-up    Pt in room 2 here for follow up tremors. Pt tremors are the stable,days varies on worsen tremors.     HISTORY OF PRESENT ILLNESS:  06/14/22 ALL:  Candice Roth returns for follow up for ET. Last seen 11/2021. We continued propranolol LA '80mg'$  daily and added topiramate '50mg'$  daily. She reports after taking topiramate for about three days, she had really bad headaches. She continued for about three weeks then discontinued. Headaches improved once stopped. Tremor is about the same. She continues to note most difficulty with writing. She used her non dominant hand to hold dominant hand when writing. She has to be careful when eating. She continues to have voice tremor. She also notes chronic neuropathy. BP is usually well managed. She is tolerating propranolol well.   11/29/2021 ALL: Candice Roth returns for follow up for ET. We increased propranolol to '80mg'$  daily at last visit 03/2021. She has not noted any improvement in tremor since. She is tolerating well. She continues to note left > right hand, voice and jaw tremor. Worse when writing or concentrating.   03/29/2021 ALL: Candice Roth returns for follow up for ET. We switched her from primidone to propranolol at last visit 09/2020. She reports that tremor has improved. She is not able to sign her name and feels it is legible. She continues to have left > right had tremor. Voice is stable, maybe slightly better. She has had more jerking tremors of jaw. She is tolerating propranolol well.   09/28/2020 ALL: Candice Roth is a 72 y.o. female here today for follow up for essential tremor. She was started on primidone at consult visit with Dr Rexene Alberts. She was advised to take '25mg'$  at bedtime and titrate to total dose of '100mg'$  at bedtime, as tolerated. She has increased dose to '100mg'$  at bedtime without any significant improvement. She has very mild dizziness from time to time after taking it but otherwise tolerating  well. She does not feel tremor has improved at all on this medication. Left hand worse than right. Activity worsens tremor. Voice tremor unchanged.    HISTORY (copied from Dr Guadelupe Sabin previous note)  Dear Thayer Headings,   I saw your patient, Candice Roth, upon your kind request, in my Neurologic clinic today for initial consultation of her tremor and voice changes, concern for parkinsonism.  The patient is accompanied by her younger daughter, Erasmo Downer, today.  As you know, [name] is a 72 year old left-handed woman with an underlying medical history of hyperlipidemia, anxiety, reflux disease, and chronic back pain, who reports a several month history of tremors affecting her left more than right hand.  She has noticed a voice tremor in the recent months.  She feels that the tremor is more noticeable when she reaches for something or hold something.  She has noticed the tremor while she is holding onto the steering wheel but has not had any difficulty driving.  She is not good with directions but this is not a new problem.  She has had increase in anxiety.  She has been on sertraline in the recent past but tapered off of it on her own.  She thought the sertraline made her tremor worse.  She has a family history of tremors affecting both grandmothers, her maternal grandmother had a hand tremor and her paternal grandmother had a voice tremor.  No telltale family history of Parkinson's disease.  She does not remember that her mom had a tremor,  she lived to be close to 27 years old.  Her father died younger at the age of 29 from cancer.  Father's brother also died younger from an accident.  Patient has 2 sisters, neither 1 has a tremor.  She has 3 children, an older daughter and a set of twins, son and daughter, neither 1 with tremors.  She is separated, she moved to [place] from [place] in October 2019 and lives with her younger daughter, Erasmo Downer who is here today, [name]'s husband and their 4 boys.   Patient quit smoking some 3 to 4 years ago, she vapes nicotine.  She drinks alcohol very rarely.  She drinks diet Pepsi but it is caffeine free and no other caffeine generally speaking.  She does not drink a whole lot of water.  She has not fallen.  She has had some right hip pain lately.  She has low back pain issues as well.  She has had some changes in her gait per daughter, sometimes she looks like she is more hunched over.  Sometimes she walks with a shuffle.  She has had difficulty with her handwriting and can still present but has difficulty with cursive.     REVIEW OF SYSTEMS: Out of a complete 14 system review of symptoms, the patient complains only of the following symptoms, hand and voice tremor and all other reviewed systems are negative.   ALLERGIES: Allergies  Allergen Reactions   Shellfish Allergy     Other reaction(s): Shortness of breath and hives     HOME MEDICATIONS: Outpatient Medications Prior to Visit  Medication Sig Dispense Refill   atorvastatin (LIPITOR) 20 MG tablet Take 20 mg by mouth daily.     lisinopril (ZESTRIL) 5 MG tablet Take 5 mg by mouth daily.     propranolol ER (INDERAL LA) 80 MG 24 hr capsule Take 1 capsule (80 mg total) by mouth daily. 90 capsule 3   topiramate (TOPAMAX) 50 MG tablet Take 1 tablet (50 mg total) by mouth 2 (two) times daily. (Patient not taking: Reported on 06/14/2022) 90 tablet 1   No facility-administered medications prior to visit.     PAST MEDICAL HISTORY: Past Medical History:  Diagnosis Date   Anxiety    GERD (gastroesophageal reflux disease)    Hyperlipidemia      PAST SURGICAL HISTORY: Past Surgical History:  Procedure Laterality Date   ABDOMINAL HYSTERECTOMY     CATARACT EXTRACTION     KNEE ARTHROSCOPY Right 11/19/2020   SHOULDER SURGERY Right    yag treatment left eye       FAMILY HISTORY: Family History  Problem Relation Age of Onset   Heart attack Mother    Heart attack Father    Cancer Father         Oat cell      SOCIAL HISTORY: Social History   Socioeconomic History   Marital status: Legally Separated    Spouse name: Not on file   Number of children: Not on file   Years of education: Not on file   Highest education level: Not on file  Occupational History   Not on file  Tobacco Use   Smoking status: Former   Smokeless tobacco: Never  Substance and Sexual Activity   Alcohol use: Yes    Comment: Rare   Drug use: Never   Sexual activity: Not on file  Other Topics Concern   Not on file  Social History Narrative   Not on file   Social  Determinants of Health   Financial Resource Strain: Not on file  Food Insecurity: Not on file  Transportation Needs: Not on file  Physical Activity: Not on file  Stress: Not on file  Social Connections: Not on file  Intimate Partner Violence: Not on file    PHYSICAL EXAM  Vitals:   06/14/22 1114  BP: 111/73  Pulse: 65  Weight: 138 lb (62.6 kg)  Height: '5\' 5"'$  (1.651 m)      Body mass index is 22.96 kg/m.   Generalized: Well developed, in no acute distress  Cardiology: normal rate and rhythm, no murmur auscultated  Respiratory: clear to auscultation bilaterally    Neurological examination  Mentation: Alert oriented to time, place, history taking. Follows all commands speech and language fluent, mild dysphonia  Cranial nerve II-XII: Pupils were equal round reactive to light. Extraocular movements were full, visual field were full on confrontational test. Facial sensation and strength were normal.Head turning and shoulder shrug  were normal and symmetric. Motor: The motor testing reveals 5 over 5 strength of all 4 extremities. Good symmetric motor tone is noted throughout. Left hand> right hand tremor. Left hand with tremor at rest but significant worsening with action. No resting tremor noted on right.  Gait and station: Gait is normal.     DIAGNOSTIC DATA (LABS, IMAGING, TESTING) - I reviewed patient records, labs,  notes, testing and imaging myself where available.  No results found for: "WBC", "HGB", "HCT", "MCV", "PLT" No results found for: "NA", "K", "CL", "CO2", "GLUCOSE", "BUN", "CREATININE", "CALCIUM", "PROT", "ALBUMIN", "AST", "ALT", "ALKPHOS", "BILITOT", "GFRNONAA", "GFRAA" No results found for: "CHOL", "HDL", "LDLCALC", "LDLDIRECT", "TRIG", "CHOLHDL" No results found for: "HGBA1C" No results found for: "VITAMINB12" Lab Results  Component Value Date   TSH 1.140 06/18/2020        No data to display               No data to display           ASSESSMENT AND PLAN  72 y.o. year old female  has a past medical history of Anxiety, GERD (gastroesophageal reflux disease), and Hyperlipidemia. here with     Essential tremor  Voice tremor  Tanja continues to note left > right hand and voice tremor. We will continue propranolol LA '80mg'$  daily. I will add gabapentin starting with '100mg'$  daily and titrating dose to '100mg'$  TID as tolerated. Possible side effects reviewed. May consider referral to [facility] for consideration of DBS if no improvement. She was given educational materials on tremor and propranolol in AVS. She will follow up in 6 months, sooner if needed. She verbalizes understanding and agreement with this plan.   No orders of the defined types were placed in this encounter.     Meds ordered this encounter  Medications   gabapentin (NEURONTIN) 100 MG capsule    Sig: Take 1 capsule (100 mg total) by mouth 3 (three) times daily. Start '100mg'$  daily for 1 week then increase dose to '100mg'$  twice daily for 1 week then increase to '100mg'$  three times daily.    Dispense:  270 capsule    Refill:  1    Order Specific Question:   Supervising Provider    Answer:   Melvenia Beam [id number], MSN, FNP-C 06/14/2022, 12:15 PM  [facility] [address], [place], [place] [address] [phone]

## 2022-06-14 ENCOUNTER — Encounter: Payer: Self-pay | Admitting: Family Medicine

## 2022-06-14 ENCOUNTER — Ambulatory Visit: Payer: Medicare Other | Admitting: Family Medicine

## 2022-06-14 VITALS — BP 111/73 | HR 65 | Ht 65.0 in | Wt 138.0 lb

## 2022-06-14 DIAGNOSIS — G250 Essential tremor: Secondary | ICD-10-CM

## 2022-06-14 DIAGNOSIS — R498 Other voice and resonance disorders: Secondary | ICD-10-CM

## 2022-06-14 MED ORDER — GABAPENTIN 100 MG PO CAPS: 100.0000 mg | ORAL_CAPSULE | Freq: Three times a day (TID) | ORAL | 1 refills | Status: DC

## 2022-08-29 ENCOUNTER — Other Ambulatory Visit: Payer: Self-pay | Admitting: Family Medicine

## 2022-08-29 DIAGNOSIS — Z780 Asymptomatic menopausal state: Secondary | ICD-10-CM

## 2022-09-08 IMAGING — US US  BREAST BX W/ LOC DEV 1ST LESION IMG BX SPEC US GUIDE*R*
1 series · 11 of 11 positions shown · non-contrast
Comparison: None Available.
COMPARISON: None Available.

Addendum:
CLINICAL DATA: 70-year-old female presenting for ultrasound-guided
biopsy of a right breast mass.

EXAM:
ULTRASOUND GUIDED RIGHT BREAST CORE NEEDLE BIOPSY

[Series 1: us breast bx w/ loc dev 1st lesion img bx spec us  · 0.05mm/px · 11 of 11 slices shown]
[im 1/11]
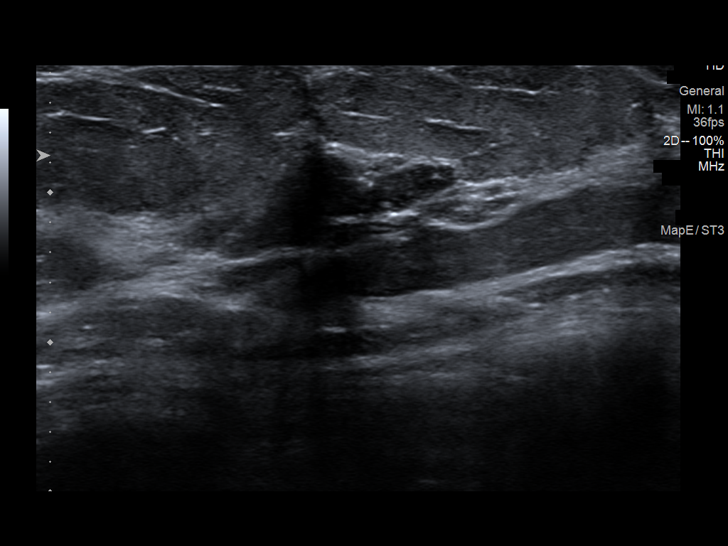
[im 2/11]
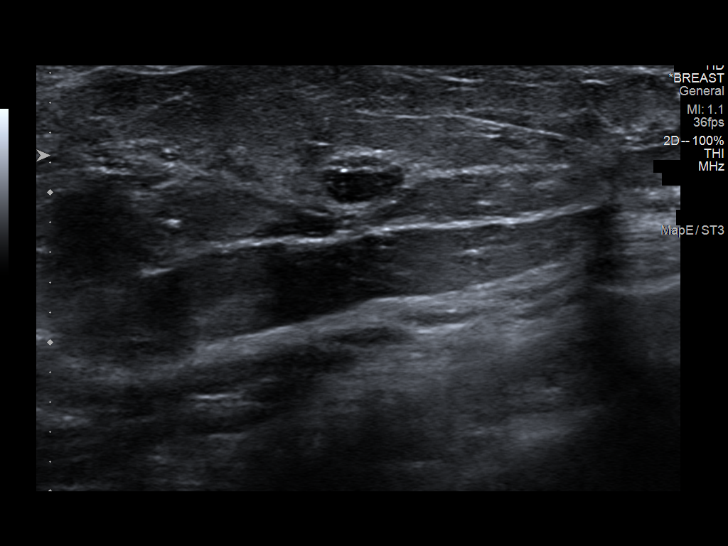
[im 3/11]
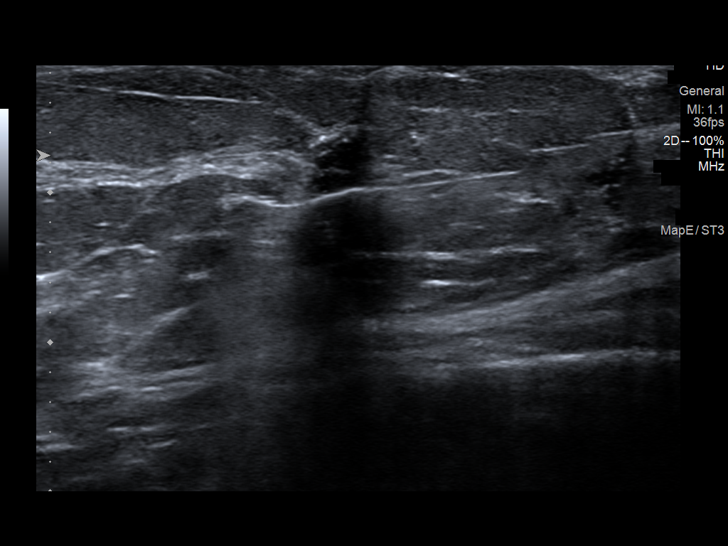
[im 4/11]
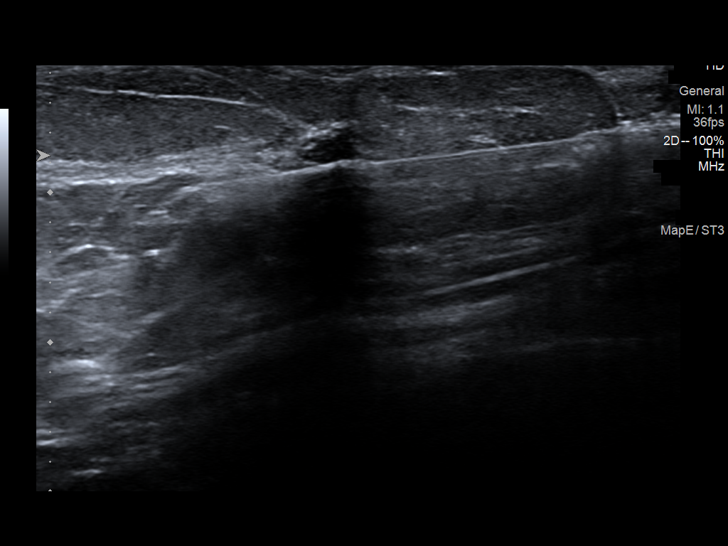
[im 5/11]
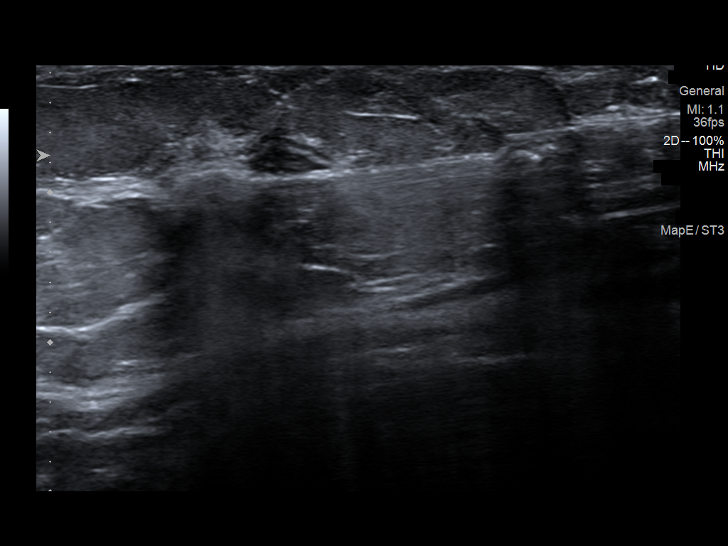
[im 6/11]
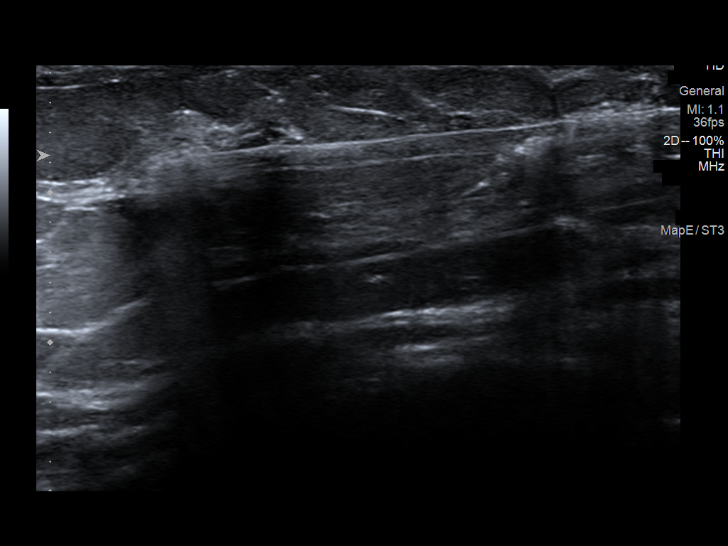
[im 7/11]
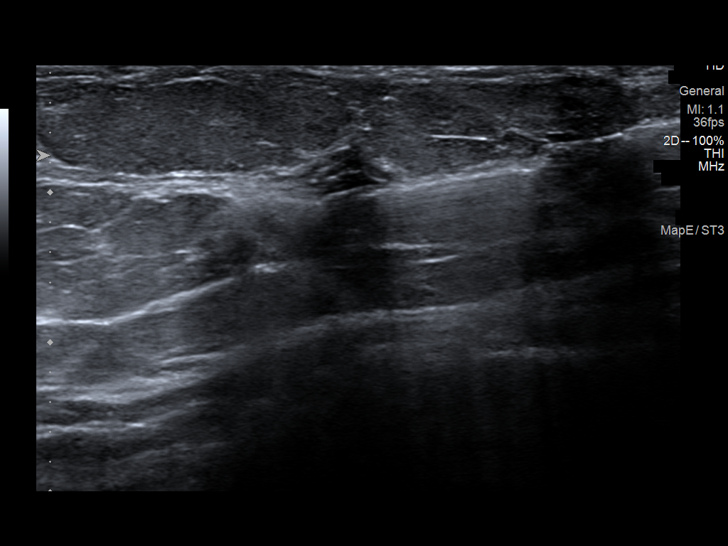
[im 8/11]
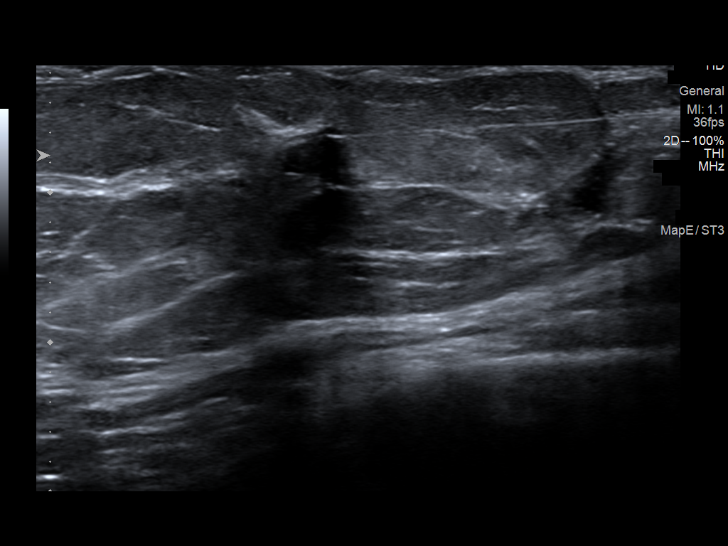
[im 9/11]
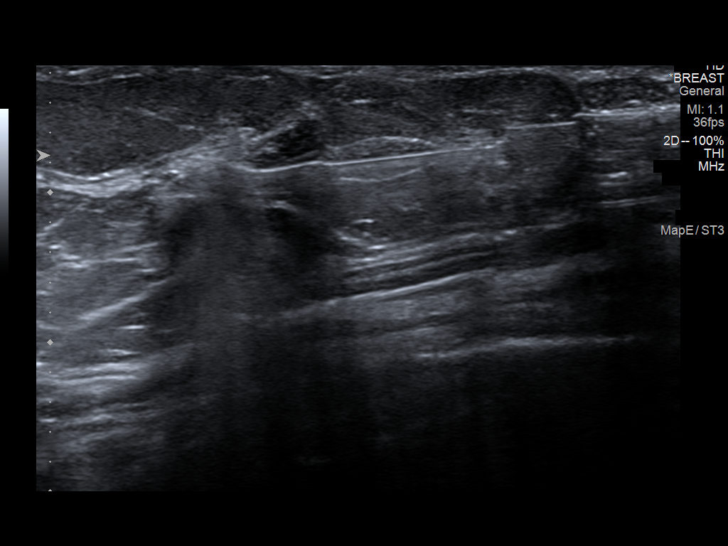
[im 10/11]
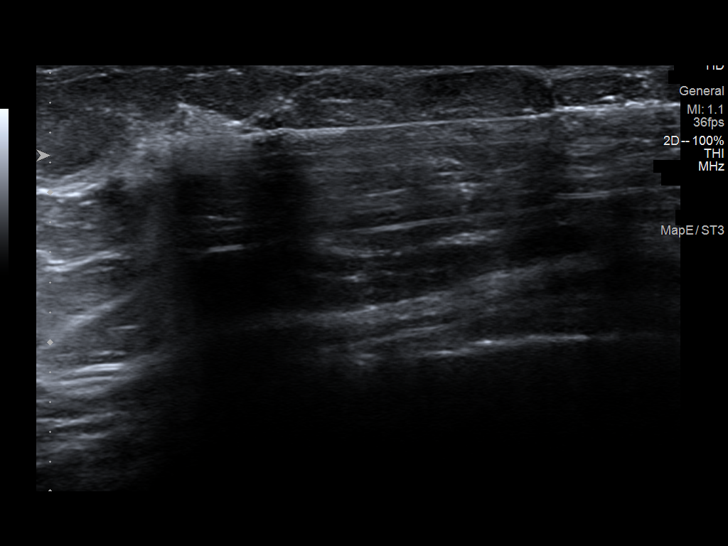
[im 11/11]
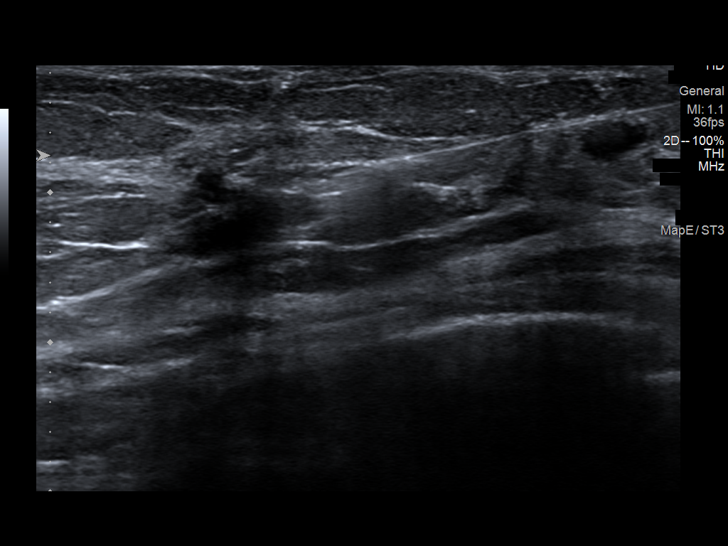

[11 of 11 positions shown; findings below may reference images not displayed]



Lesion quadrant: Upper outer quadrant

Using sterile technique and 1% Lidocaine as local anesthetic, under
direct ultrasound visualization, a 14 gauge Avong device was
used to perform biopsy of a mass in the right breast at 9 o'clock, 5
cm from the nipple using a lateral approach. At the conclusion of
the procedure a ribbon shaped tissue marker clip was deployed into
the biopsy cavity. Follow up 2 view mammogram was performed and
dictated separately.
IMPRESSION: Ultrasound guided biopsy of a right breast mass at 9 o'clock. No
apparent complications.

ADDENDUM:
Pathology revealed FIBROADENOMA of the RIGHT breast, 9 o'clock,
7cmfn (ribbon clip). This was found to be concordant by Dr. Billings
Akewusola.

Pathology results were discussed with the patient by telephone. The
patient reported doing well after the biopsy with tenderness and
bruising at the site. Post biopsy instructions and care were
reviewed and questions were answered. The patient was encouraged to
call The [REDACTED] for any additional
concerns. My direct phone number was provided.

The patient was instructed to return for annual screening

Pathology results reported by Zdeinik Heyabe, RN on 09/23/2021.



Lesion quadrant: Upper outer quadrant

Using sterile technique and 1% Lidocaine as local anesthetic, under
direct ultrasound visualization, a 14 gauge Avong device was
used to perform biopsy of a mass in the right breast at 9 o'clock, 5
cm from the nipple using a lateral approach. At the conclusion of
the procedure a ribbon shaped tissue marker clip was deployed into
the biopsy cavity. Follow up 2 view mammogram was performed and
dictated separately.
IMPRESSION: Ultrasound guided biopsy of a right breast mass at 9 o'clock. No
apparent complications.

## 2022-09-08 IMAGING — MG MM BREAST LOCALIZATION CLIP
4 series · 4 of 12 positions shown · non-contrast
Comparison: Previous exam(s).

CLINICAL DATA: Post biopsy mammogram of the right breast for clip
placement.

EXAM:
3D DIAGNOSTIC RIGHT MAMMOGRAM POST ULTRASOUND BIOPSY

[R CC synth-2D]
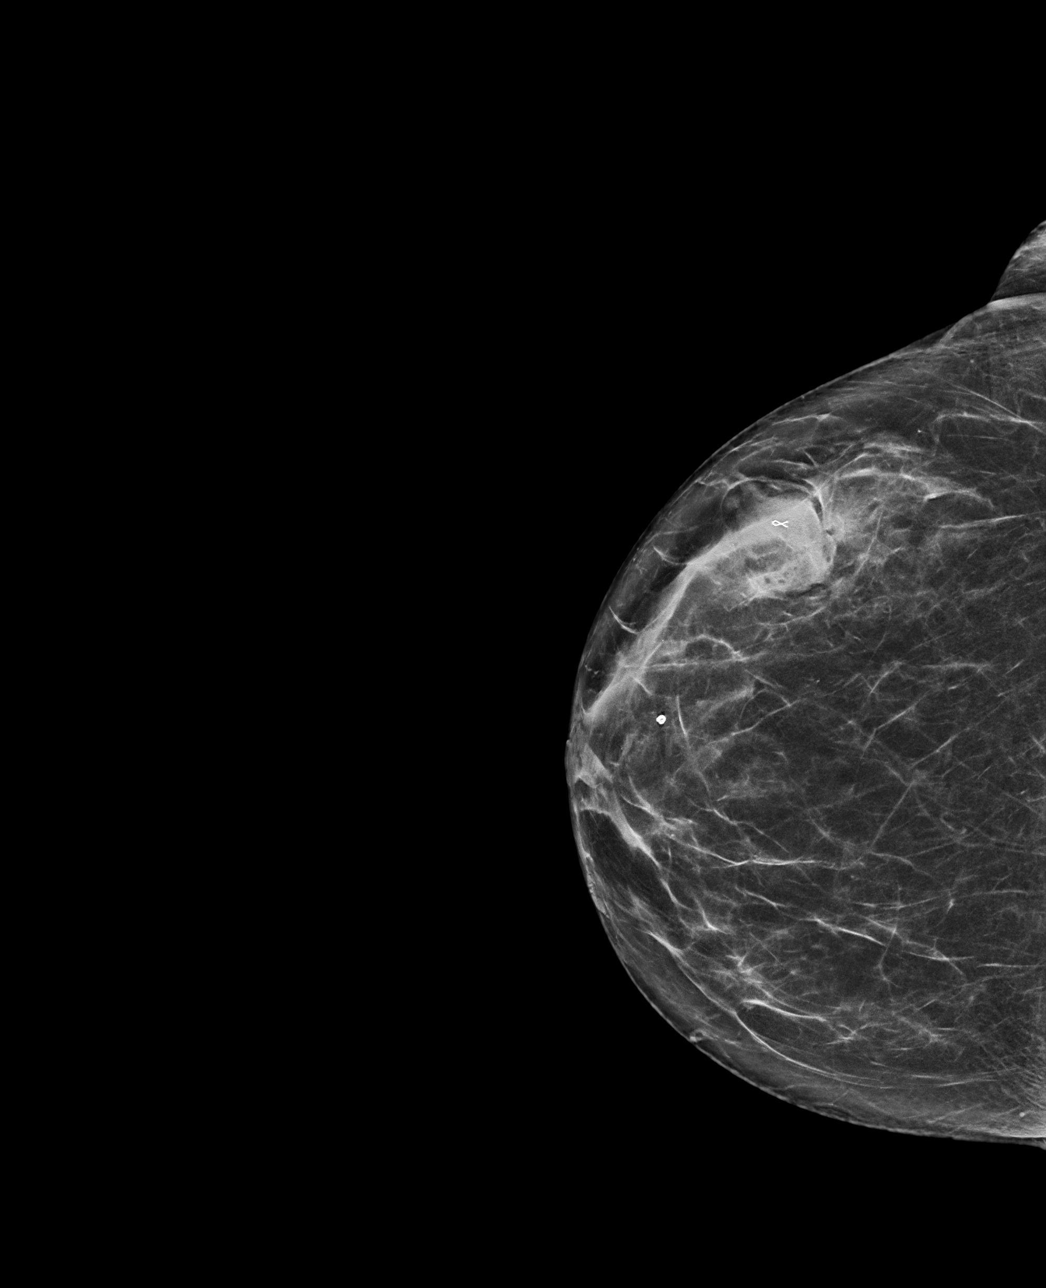

[R ML synth-2D]
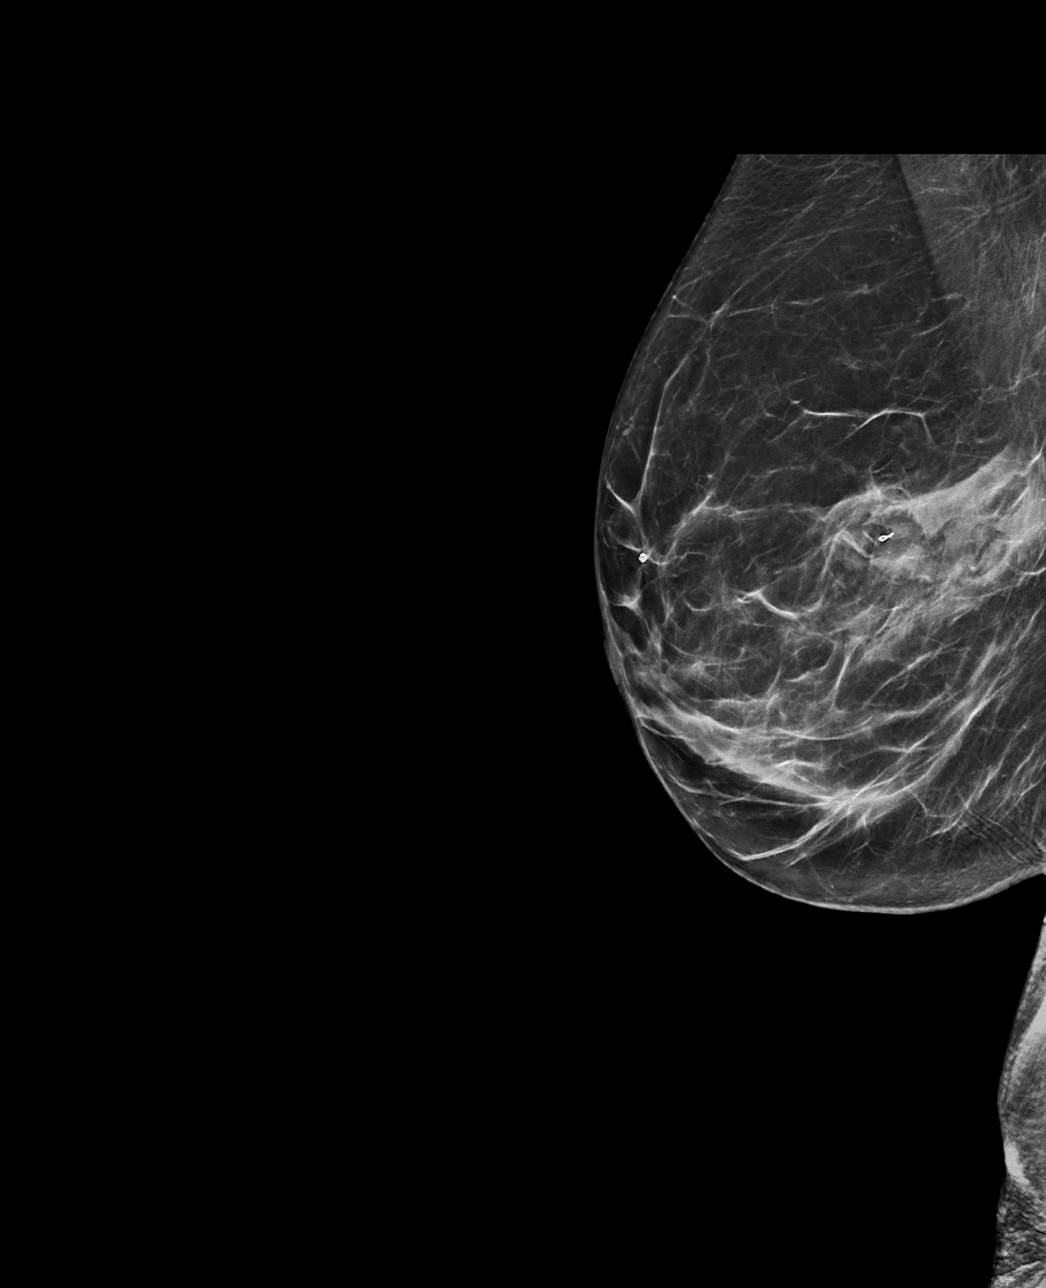

[R CC tomo · tomo slice 35/70.0]
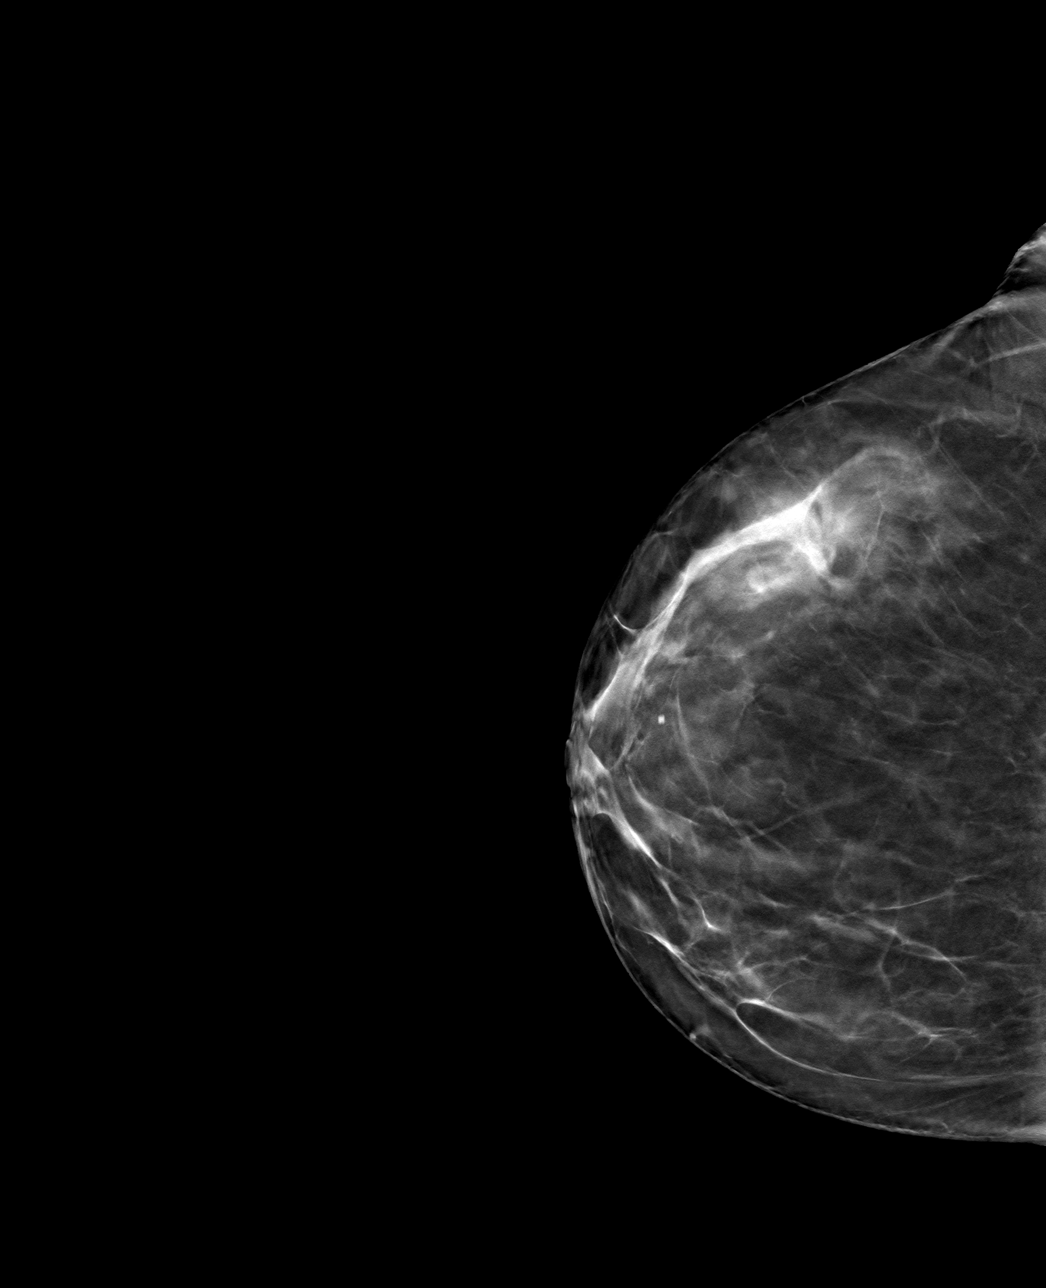

[R ML tomo · tomo slice 36/71.0]
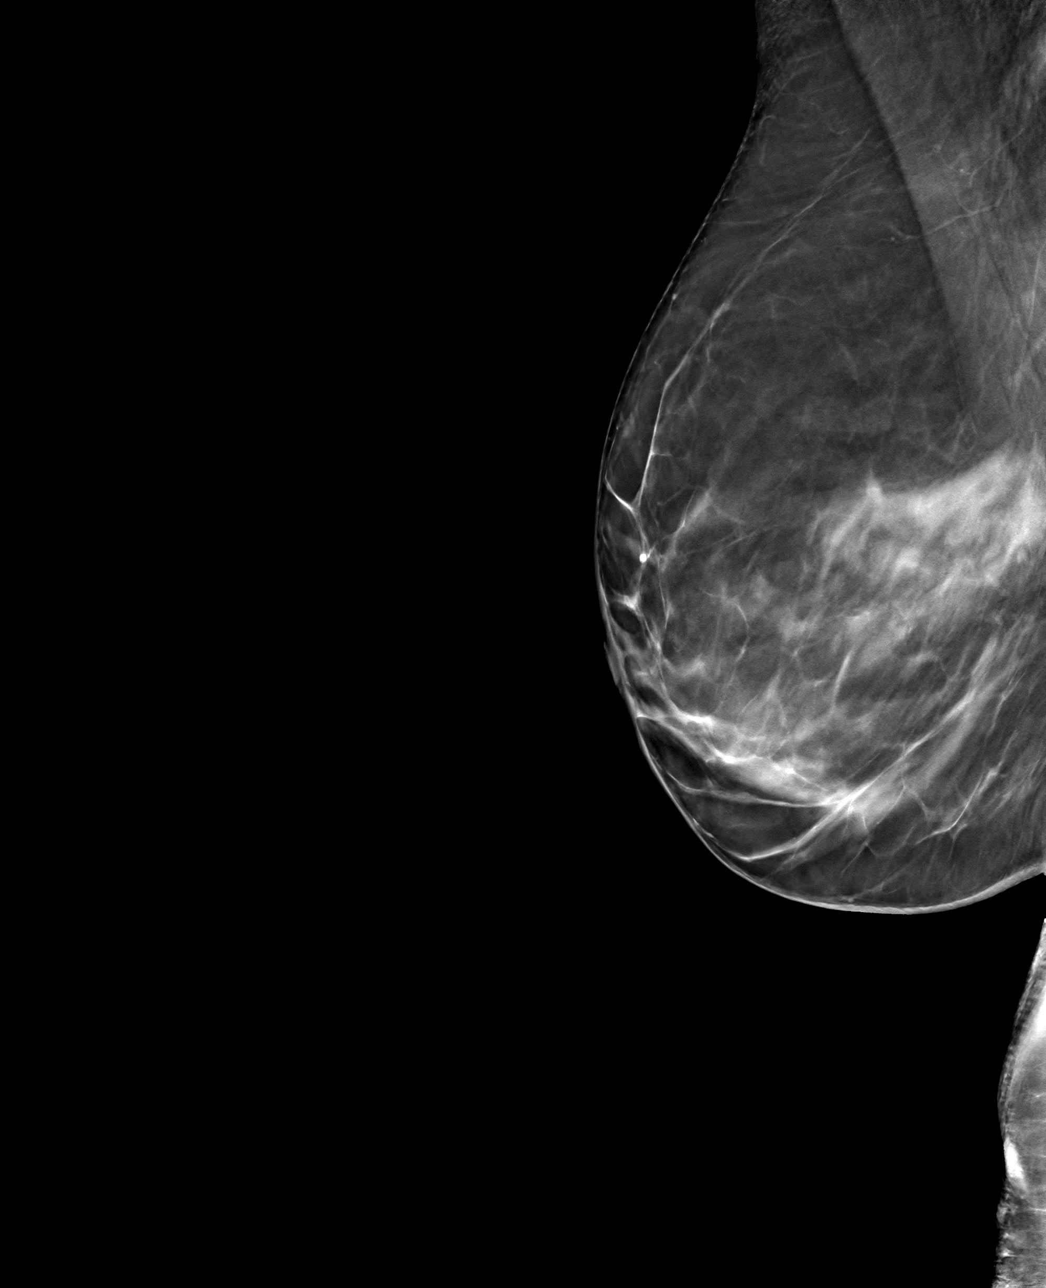

[4 of 12 positions shown; findings below may reference images not displayed]

FINDINGS: 3D Mammographic images were obtained following ultrasound guided
biopsy of a mass in the right breast at 9 o'clock. The biopsy
marking clip is in expected position at the site of biopsy. There is
a 2.5 cm hematoma at the biopsy site.
IMPRESSION: 1. Appropriate positioning of the ribbon shaped biopsy marking clip
at the site of biopsy in the lateral right breast.

2.  There is a 2.5 cm hematoma at the biopsy site.

Final Assessment: Post Procedure Mammograms for Marker Placement

## 2022-09-20 ENCOUNTER — Other Ambulatory Visit: Payer: Self-pay | Admitting: Family Medicine

## 2022-09-20 DIAGNOSIS — Z1231 Encounter for screening mammogram for malignant neoplasm of breast: Secondary | ICD-10-CM

## 2022-10-26 ENCOUNTER — Ambulatory Visit: Payer: Medicare Other

## 2022-10-26 DIAGNOSIS — Z780 Asymptomatic menopausal state: Secondary | ICD-10-CM | POA: Diagnosis not present

## 2022-10-26 DIAGNOSIS — Z1231 Encounter for screening mammogram for malignant neoplasm of breast: Secondary | ICD-10-CM | POA: Diagnosis not present

## 2022-11-03 ENCOUNTER — Encounter: Payer: Self-pay | Admitting: Family Medicine

## 2022-11-30 NOTE — Progress Notes (Signed)
Chief Complaint  Patient presents with   Room 1    Pt is here Alone. Pt states no change in her tremors. Pt states that the Gabapentin isn't working. Pt states that she hasn't been dropping things lately. Pt is weaning herself off of the Gabapentin.     HISTORY OF PRESENT ILLNESS:  12/06/22 ALL:  Candice Roth returns for follow up for ET. She was last seen 05/2022 and reported worsening tremor. We continued propranolol and added gabapentin. She does not feel it has been helpful. She titrated to 100mg  TID. She felt more off balanced with increased dose. She has recently weaned back to 100mg  BID and would like to stop this medication. She continues to tolerate propranolol. Unsure of normal BP readings. Last 11/72 05/2022. Today 137/93. She does not check at home. She failed topiramate d/t headaches. Primidone 100mg  was not effective. Tremor is fairly stable. Most of left hand and voice. She has difficulty writing but able to complete ADLs independently. No trouble eating. No difficulty swallowing. No balance difficulty when not taking gabapentin. No family history of PD.   06/14/2022 ALL: Candice Roth returns for follow up for ET. Last seen 11/2021. We continued propranolol LA 80mg  daily and added topiramate 50mg  daily. She reports after taking topiramate for about three days, she had really bad headaches. She continued for about three weeks then discontinued. Headaches improved once stopped. Tremor is about the same. She continues to note most difficulty with writing. She used her non dominant hand to hold dominant hand when writing. She has to be careful when eating. She continues to have voice tremor. She also notes chronic neuropathy. BP is usually well managed. She is tolerating propranolol well.   11/29/2021 ALL: Candice Roth returns for follow up for ET. We increased propranolol to 80mg  daily at last visit 03/2021. She has not noted any improvement in tremor since. She is tolerating well. She continues to note  left > right hand, voice and jaw tremor. Worse when writing or concentrating.   03/29/2021 ALL: Candice Roth returns for follow up for ET. We switched her from primidone to propranolol at last visit 09/2020. She reports that tremor has improved. She is not able to sign her name and feels it is legible. She continues to have left > right had tremor. Voice is stable, maybe slightly better. She has had more jerking tremors of jaw. She is tolerating propranolol well.   09/28/2020 ALL: Candice Roth is a 72 y.o. female here today for follow up for essential tremor. She was started on primidone at consult visit with Dr Frances Furbish. She was advised to take 25mg  at bedtime and titrate to total dose of 100mg  at bedtime, as tolerated. She has increased dose to 100mg  at bedtime without any significant improvement. She has very mild dizziness from time to time after taking it but otherwise tolerating well. She does not feel tremor has improved at all on this medication. Left hand worse than right. Activity worsens tremor. Voice tremor unchanged.    HISTORY (copied from Dr Teofilo Pod previous note)  Dear Candice Roth,   I saw your patient, Candice Roth, upon your kind request, in my Neurologic clinic today for initial consultation of her tremor and voice changes, concern for parkinsonism.  The patient is accompanied by her younger daughter, Candice Roth, today.  As you know, Candice Roth is a 72 year old left-handed woman with an underlying medical history of hyperlipidemia, anxiety, reflux disease, and chronic back pain, who reports a several month history of tremors  affecting her left more than right hand.  She has noticed a voice tremor in the recent months.  She feels that the tremor is more noticeable when she reaches for something or hold something.  She has noticed the tremor while she is holding onto the steering wheel but has not had any difficulty driving.  She is not good with directions but this is not a new problem.   She has had increase in anxiety.  She has been on sertraline in the recent past but tapered off of it on her own.  She thought the sertraline made her tremor worse.  She has a family history of tremors affecting both grandmothers, her maternal grandmother had a hand tremor and her paternal grandmother had a voice tremor.  No telltale family history of Parkinson's disease.  She does not remember that her mom had a tremor, she lived to be close to 25 years old.  Her father died younger at the age of 72 from cancer.  Father's brother also died younger from an accident.  Patient has 2 sisters, neither 1 has a tremor.  She has 3 children, an older daughter and a set of twins, son and daughter, neither 1 with tremors.  She is separated, she moved to [place] from [place] in October 2019 and lives with her younger daughter, Candice Roth who is here today, Candice Roth's husband and their 4 boys.  Patient quit smoking some 3 to 4 years ago, she vapes nicotine.  She drinks alcohol very rarely.  She drinks diet Pepsi but it is caffeine free and no other caffeine generally speaking.  She does not drink a whole lot of water.  She has not fallen.  She has had some right hip pain lately.  She has low back pain issues as well.  She has had some changes in her gait per daughter, sometimes she looks like she is more hunched over.  Sometimes she walks with a shuffle.  She has had difficulty with her handwriting and can still present but has difficulty with cursive.     REVIEW OF SYSTEMS: Out of a complete 14 system review of symptoms, the patient complains only of the following symptoms, hand and voice tremor and all other reviewed systems are negative.   ALLERGIES: Allergies  Allergen Reactions   Shellfish Allergy     Other reaction(s): Shortness of breath and hives     HOME MEDICATIONS: Outpatient Medications Prior to Visit  Medication Sig Dispense Refill   atorvastatin (LIPITOR) 20 MG tablet Take 20 mg by mouth  daily.     lisinopril (ZESTRIL) 5 MG tablet Take 5 mg by mouth daily.     propranolol ER (INDERAL LA) 80 MG 24 hr capsule Take 1 capsule (80 mg total) by mouth daily. 90 capsule 3   gabapentin (NEURONTIN) 100 MG capsule Take 1 capsule (100 mg total) by mouth 3 (three) times daily. Start 100mg  daily for 1 week then increase dose to 100mg  twice daily for 1 week then increase to 100mg  three times daily. (Patient not taking: Reported on 12/06/2022) 270 capsule 1   No facility-administered medications prior to visit.     PAST MEDICAL HISTORY: Past Medical History:  Diagnosis Date   Anxiety    GERD (gastroesophageal reflux disease)    Hyperlipidemia      PAST SURGICAL HISTORY: Past Surgical History:  Procedure Laterality Date   ABDOMINAL HYSTERECTOMY     CATARACT EXTRACTION     KNEE ARTHROSCOPY Right 11/19/2020  SHOULDER SURGERY Right    yag treatment left eye       FAMILY HISTORY: Family History  Problem Relation Age of Onset   Heart attack Mother    Heart attack Father    Cancer Father        Oat cell      SOCIAL HISTORY: Social History   Socioeconomic History   Marital status: Legally Separated    Spouse name: Not on file   Number of children: Not on file   Years of education: Not on file   Highest education level: Not on file  Occupational History   Not on file  Tobacco Use   Smoking status: Former   Smokeless tobacco: Never  Substance and Sexual Activity   Alcohol use: Yes    Comment: Rare   Drug use: Never   Sexual activity: Not on file  Other Topics Concern   Not on file  Social History Narrative   Not on file   Social Determinants of Health   Financial Resource Strain: Not on file  Food Insecurity: Not on file  Transportation Needs: Not on file  Physical Activity: Not on file  Stress: Not on file  Social Connections: Not on file  Intimate Partner Violence: Not on file    PHYSICAL EXAM  Vitals:   12/06/22 1053  BP: (!) 137/93  Pulse: 66   Weight: 139 lb 8 oz (63.3 kg)  Height: 5\' 5"  (1.651 m)       Body mass index is 23.21 kg/m.   Generalized: Well developed, in no acute distress  Cardiology: normal rate and rhythm, no murmur auscultated  Respiratory: clear to auscultation bilaterally    Neurological examination  Mentation: Alert oriented to time, place, history taking. Follows all commands speech and language fluent, mild dysphonia  Cranial nerve II-XII: Pupils were equal round reactive to light. Extraocular movements were full, visual field were full on confrontational test. Facial sensation and strength were normal.Head turning and shoulder shrug  were normal and symmetric. Motor: The motor testing reveals 5 over 5 strength of all 4 extremities. Good symmetric motor tone is noted throughout. Left hand> right hand tremor. Left hand with tremor at rest but significant worsening with action. Left hand postural tremor. No resting tremor noted on right. NO bradykinesia noted.  Gait and station: Gait is normal.     DIAGNOSTIC DATA (LABS, IMAGING, TESTING) - I reviewed patient records, labs, notes, testing and imaging myself where available.  No results found for: "WBC", "HGB", "HCT", "MCV", "PLT" No results found for: "NA", "K", "CL", "CO2", "GLUCOSE", "BUN", "CREATININE", "CALCIUM", "PROT", "ALBUMIN", "AST", "ALT", "ALKPHOS", "BILITOT", "GFRNONAA", "GFRAA" No results found for: "CHOL", "HDL", "LDLCALC", "LDLDIRECT", "TRIG", "CHOLHDL" No results found for: "HGBA1C" No results found for: "VITAMINB12" Lab Results  Component Value Date   TSH 1.140 06/18/2020        No data to display               No data to display           ASSESSMENT AND PLAN  72 y.o. year old female  has a past medical history of Anxiety, GERD (gastroesophageal reflux disease), and Hyperlipidemia. here with     Essential tremor  Candice Roth continues to note left > right hand and voice tremor. We will continue propranolol LA  80mg  daily. She will continue to wean gabapentin taking 100mg  daily for the next week then may stop. We discussed increasing propranolol versus adding primidone. She  wishes to monitor for now. May consider referral to to [facility] for consideration of DBS if no improvement. Unable to travel to academic center for consideration of [facility]. She was given educational materials on tremor and propranolol in AVS. She will follow up in 1 year, sooner if needed. I will have her see Dr Frances Furbish for reevaluation. She verbalizes understanding and agreement with this plan.    No orders of the defined types were placed in this encounter.     Meds ordered this encounter  Medications   propranolol ER (INDERAL LA) 80 MG 24 hr capsule    Sig: Take 1 capsule (80 mg total) by mouth daily.    Dispense:  90 capsule    Refill:  3    Order Specific Question:   Supervising Provider    Answer:   Anson Fret [[id number]]     Shawnie Dapper, MSN, FNP-C 12/06/2022, 11:26 AM  [facility] [address] [place], [place] [address] [phone]

## 2022-11-30 NOTE — Patient Instructions (Signed)
Below is our plan:  We will continue propranolol LA 80mg  daily. Decrease dose of gabapentin to 100mg  daily for the next week then discontinue. We can consider increasing propranolol if needed and BP is stable. We could try adding primidone to propranolol if you wish. We could consider referral to Dr Tat with [facility] for consideration of deep brain stimulation or to [facility] for consideration of Neurovive treatment.   Please make sure you are staying well hydrated. I recommend 50-60 ounces daily. Well balanced diet and regular exercise encouraged. Consistent sleep schedule with 6-8 hours recommended.   Please continue follow up with care team as directed.   Follow up with Dr Frances Furbish in 1 year   You may receive a survey regarding today's visit. I encourage you to leave honest feed back as I do use this information to improve patient care. Thank you for seeing me today!

## 2022-12-06 ENCOUNTER — Encounter: Payer: Self-pay | Admitting: Family Medicine

## 2022-12-06 ENCOUNTER — Ambulatory Visit: Payer: Medicare Other | Admitting: Family Medicine

## 2022-12-06 VITALS — BP 137/93 | HR 66 | Ht 65.0 in | Wt 139.5 lb

## 2022-12-06 DIAGNOSIS — G250 Essential tremor: Secondary | ICD-10-CM | POA: Diagnosis not present

## 2022-12-06 MED ORDER — PROPRANOLOL HCL ER 80 MG PO CP24: 80.0000 mg | ORAL_CAPSULE | Freq: Every day | ORAL | 3 refills | Status: DC

## 2023-11-06 LAB — COLOGUARD: COLOGUARD: POSITIVE — AB

## 2023-12-14 ENCOUNTER — Other Ambulatory Visit: Payer: Self-pay | Admitting: Family Medicine

## 2023-12-14 ENCOUNTER — Other Ambulatory Visit: Payer: Self-pay

## 2023-12-14 DIAGNOSIS — Z1231 Encounter for screening mammogram for malignant neoplasm of breast: Secondary | ICD-10-CM

## 2023-12-21 ENCOUNTER — Ambulatory Visit: Payer: Medicare Other | Admitting: Neurology

## 2023-12-21 ENCOUNTER — Telehealth: Payer: Self-pay | Admitting: Neurology

## 2023-12-21 VITALS — BP 122/80 | HR 63 | Ht 65.0 in | Wt 136.0 lb

## 2023-12-21 DIAGNOSIS — R498 Other voice and resonance disorders: Secondary | ICD-10-CM | POA: Diagnosis not present

## 2023-12-21 DIAGNOSIS — G250 Essential tremor: Secondary | ICD-10-CM | POA: Diagnosis not present

## 2023-12-21 MED ORDER — PROPRANOLOL HCL ER 60 MG PO CP24: 60.0000 mg | ORAL_CAPSULE | Freq: Every day | ORAL | 5 refills | Status: DC

## 2023-12-21 NOTE — Patient Instructions (Signed)
 We will reduce your Inderal  long-acting to 60 mg once daily from currently 80 mg once daily.  Continue to hydrate well with water, try to get enough rest, continue to avoid alcohol and limit caffeine.  Follow-up next year with Greig Forbes, NP, I will make a referral to [facility] for evaluation of your tremor and consideration of focused ultrasound treatment.

## 2023-12-21 NOTE — Telephone Encounter (Signed)
 Referral to Neurology faxed to [facility]    [facility] Phone: [phone] Fax : [phone]

## 2023-12-21 NOTE — Progress Notes (Signed)
 Subjective:    Patient ID: Candice Roth is a 73 y.o. female.  HPI    Interim history:   Candice Roth is a 73 year old left-handed woman with an underlying medical history of hyperlipidemia, anxiety, reflux disease, and chronic back pain, who presents for follow-up consultation of her tremors.  She was last seen in our clinic about a year ago by Greig Forbes, NP, at which time she was on propranolol  and low-dose gabapentin .  She wanted to taper off gabapentin .  She was maintained on Inderal  at the time.  Today, 12/21/2023: She reports that she is not sure if the medication is helpful for her tremor, she is interested in tapering off of the Inderal . While she is not keen on pursuing anything surgical such as DBS, she would be interested in focused ultrasound treatment.  We talked about placing a referral to [facility] for this today.  She is advised that the Inderal  long-acting only comes in 60s and 80s and higher dose, for that reason we will just reduce it today to 60 mg once daily.  She tries to hydrate well with water, she does not drink caffeine daily, typically drinks 3 cans of diet and caffeine free soda per day.  She does not drink any alcohol.  She has no issues driving.  She has not had any falls.   The patient's allergies, current medications, family history, past medical history, past social history, past surgical history and problem list were reviewed and updated as appropriate.   Previously:   12/06/2022 (Amy Lomax, NP): << Candice Roth returns for follow up for ET. She was last seen 05/2022 and reported worsening tremor. We continued propranolol  and added gabapentin . She does not feel it has been helpful. She titrated to 100mg  TID. She felt more off balanced with increased dose. She has recently weaned back to 100mg  BID and would like to stop this medication. She continues to tolerate propranolol . Unsure of normal BP readings. Last 75/72 05/2022. Today 137/93. She does not check at home.  She failed topiramate  d/t headaches. Primidone  100mg  was not effective. Tremor is fairly stable. Most of left hand and voice. She has difficulty writing but able to complete ADLs independently. No trouble eating. No difficulty swallowing. No balance difficulty when not taking gabapentin . No family history of PD. >>   06/14/2022 (ALL): << Candice Roth returns for follow up for ET. Last seen 11/2021. We continued propranolol  LA 80mg  daily and added topiramate  50mg  daily. She reports after taking topiramate  for about three days, she had really bad headaches. She continued for about three weeks then discontinued. Headaches improved once stopped. Tremor is about the same. She continues to note most difficulty with writing. She used her non dominant hand to hold dominant hand when writing. She has to be careful when eating. She continues to have voice tremor. She also notes chronic neuropathy. BP is usually well managed. She is tolerating propranolol  well. >>   11/29/2021 (ALL):  << Candice Roth returns for follow up for ET. We increased propranolol  to 80mg  daily at last visit 03/2021. She has not noted any improvement in tremor since. She is tolerating well. She continues to note left > right hand, voice and jaw tremor. Worse when writing or concentrating.    03/29/2021 (ALL): Candice Roth returns for follow up for ET. We switched her from primidone  to propranolol  at last visit 09/2020. She reports that tremor has improved. She is not able to sign her name and feels it is legible. She continues to  have left > right had tremor. Voice is stable, maybe slightly better. She has had more jerking tremors of jaw. She is tolerating propranolol  well. >>   09/28/2020 (ALL):  << Candice Roth is a 73 y.o. female here today for follow up for essential tremor. She was started on primidone  at consult visit with Dr Buck. She was advised to take 25mg  at bedtime and titrate to total dose of 100mg  at bedtime, as tolerated. She has  increased dose to 100mg  at bedtime without any significant improvement. She has very mild dizziness from time to time after taking it but otherwise tolerating well. She does not feel tremor has improved at all on this medication. Left hand worse than right. Activity worsens tremor. Voice tremor unchanged.  >>  06/18/2020 (SA): (She) reports a several month history of tremors affecting her left more than right hand.  She has noticed a voice tremor in the recent months.  She feels that the tremor is more noticeable when she reaches for something or hold something.  She has noticed the tremor while she is holding onto the steering wheel but has not had any difficulty driving.  She is not good with directions but this is not a new problem.  She has had increase in anxiety.  She has been on sertraline in the recent past but tapered off of it on her own.  She thought the sertraline made her tremor worse.  She has a family history of tremors affecting both grandmothers, her maternal grandmother had a hand tremor and her paternal grandmother had a voice tremor.  No telltale family history of Parkinson's disease.  She does not remember that her mom had a tremor, she lived to be close to 56 years old.  Her father died younger at the age of 10 from cancer.  Father's brother also died younger from an accident.  Patient has 2 sisters, neither 1 has a tremor.  She has 3 children, an older daughter and a set of twins, son and daughter, neither 1 with tremors.  She is separated, she moved to [place]  from [place]  in October 2019 and lives with her younger daughter, Candice Roth who is here today, Candice Roth's husband and their 4 boys.  Patient quit smoking some 3 to 4 years ago, she vapes nicotine.  She drinks alcohol very rarely.  She drinks diet Pepsi but it is caffeine free and no other caffeine generally speaking.  She does not drink a whole lot of water.  She has not fallen.  She has had some right hip pain lately.  She has low  back pain issues as well.  She has had some changes in her gait per daughter, sometimes she looks like she is more hunched over.  Sometimes she walks with a shuffle.  She has had difficulty with her handwriting and can still present but has difficulty with cursive.     Her Past Medical History Is Significant For: Past Medical History:  Diagnosis Date   Anxiety    GERD (gastroesophageal reflux disease)    Hyperlipidemia     Her Past Surgical History Is Significant For: Past Surgical History:  Procedure Laterality Date   ABDOMINAL HYSTERECTOMY     CATARACT EXTRACTION     KNEE ARTHROSCOPY Right 11/19/2020   SHOULDER SURGERY Right    yag treatment left eye      Her Family History Is Significant For: Family History  Problem Relation Age of Onset   Heart attack Mother  Heart attack Father    Cancer Father        Oat cell     Her Social History Is Significant For: Social History   Socioeconomic History   Marital status: Legally Separated    Spouse name: Not on file   Number of children: Not on file   Years of education: Not on file   Highest education level: Not on file  Occupational History   Not on file  Tobacco Use   Smoking status: Former   Smokeless tobacco: Never  Substance and Sexual Activity   Alcohol use: Yes    Comment: Rare   Drug use: Never   Sexual activity: Not on file  Other Topics Concern   Not on file  Social History Narrative   Not on file   Social Drivers of Health   Financial Resource Strain: Not on file  Food Insecurity: Not on file  Transportation Needs: Not on file  Physical Activity: Not on file  Stress: Not on file  Social Connections: Not on file    Her Allergies Are:  Allergies  Allergen Reactions   Shellfish Allergy     Other reaction(s): Shortness of breath and hives  :   Her Current Medications Are:  Outpatient Encounter Medications as of 12/21/2023  Medication Sig   alendronate (FOSAMAX) 70 MG tablet Take 70 mg by mouth  once a week. Take with a full glass of water on an empty stomach.   atorvastatin (LIPITOR) 20 MG tablet Take 20 mg by mouth daily.   lisinopril (ZESTRIL) 5 MG tablet Take 5 mg by mouth daily.   propranolol  ER (INDERAL  LA) 80 MG 24 hr capsule Take 1 capsule (80 mg total) by mouth daily.   No facility-administered encounter medications on file as of 12/21/2023.  :  Review of Systems:  Out of a complete 14 point review of systems, all are reviewed and negative with the exception of these symptoms as listed below:  Review of Systems  Neurological:        Patient presents today for follow up on her tremor. She is unsure if medications are helping and is interested in tapering off to see if there is a difference.    Objective:  Neurological Exam  Physical Exam Physical Examination:   Vitals:   12/21/23 1038  BP: 122/80  Pulse: 63    General Examination: The patient is a very pleasant 73 y.o. female in no acute distress. She appears well-developed and well-nourished and well groomed.   HEENT: Normocephalic, atraumatic, pupils are equal, round and reactive to light and accommodation.  She is status post bilateral cataract repairs.  Hearing is grossly intact, extraocular tracking well preserved, no nystagmus.  Face is symmetric with no significant facial masking noted, speech is mildly dysphonic, she has a significant voice tremor.  She has a fairly consistent but mild lower lip and chin tremor.  She has no significant nuchal rigidity and good range of motion in the neck, no carotid bruits.  Airway examination reveals mild  mouth dryness.  Tongue protrudes centrally and palate elevates symmetrically.    Chest: Clear to auscultation without wheezing, rhonchi or crackles noted.   Heart: S1+S2+0, regular and normal without murmurs, rubs or gallops noted.    Abdomen: Soft, non-tender and non-distended with normal bowel sounds appreciated on auscultation.   Extremities: There is no pitting  edema in the distal lower extremities bilaterally.  Mild erythema both feet.   Skin: Warm and dry without  trophic changes noted.   Musculoskeletal: exam reveals right hip discomfort, mild right knee swelling.     Neurologically:  Mental status: The patient is awake, alert and oriented in all 4 spheres. Her immediate and remote memory, attention, language skills and fund of knowledge are appropriate. There is no evidence of aphasia, agnosia, apraxia or anomia. Speech is clear with normal prosody and enunciation. Thought process is linear. Mood is normal and affect is normal.  Cranial nerves II - XII are as described above under HEENT exam. In addition: shoulder shrug is normal with equal shoulder height noted. Motor exam: Normal bulk, strength and tone is noted. There is no drift, or rebound.  She has a slight and intermittent resting tremor in the left hand, it is a faster and irregular in amplitude.   She has a mild to moderate left upper extremity postural tremor, mild action tremor, mild postural tremor in the right upper extremity with mild action component, no significant intention tremor in either upper extremity.  No lower extremity tremor noted.   (On 06/18/2020: On Archimedes spiral drawing she has mild trembling with both hands, more noticeable with the left hand, handwriting with the left hand is tremulous, legible, not micrographic.)   Romberg is not tested for safety concerns.   Fine motor skills and coordination: Mildly impaired globally.     Cerebellar testing: No dysmetria or intention tremor. There is no truncal or gait ataxia.   Sensory exam: intact to light touch in the upper and lower extremities.   Gait, station and balance: She stands without difficulty, posture is age-appropriate.  She walks with preserved arm swing, no obvious shuffling.  No walking aid.   Assessment and Plan:    In summary, Candice Roth is a very pleasant 73 year old left-handed woman with an  underlying medical history of hyperlipidemia, anxiety, reflux disease, and chronic back pain, who presents for follow-up consultation of her essential tremor of several years duration.  We talked about tremor triggers and alleviating factors again today.  She is advised to reduce her propranolol  long-acting from 80 mg to 60 mg at this time, she would like to eventually come off of it.  She has been on primidone  before and she also tried gabapentin  up until last year.  She tapered off gabapentin .  We talked about additional treatment options including DBS again today.  She is not keen on pursuing surgical treatment but would be interested in a consultation for focused ultrasound treatment.  I made a referral to [facility] for this today.  Thankfully, she has not had any balance impairment to speak of, she still drives, she tries to stay well-hydrated and avoids caffeine.  She is advised to follow-up routinely in this clinic to see Greig Forbes, NP in a year.  If she does not hear anything from [facility] in the next 2 or 3 weeks, she is advised to get in touch with our office.  I answered all her questions today and she was in agreement with our approach.  I placed a referral to [facility] today and adjusted her propranolol  prescription down from 80 mg once daily to 60 mg once daily.   I spent 40 minutes in total face-to-face time and in reviewing records during pre-charting, more than 50% of which was spent in counseling and coordination of care, reviewing test results, reviewing medications and treatment regimen and/or in discussing or reviewing the diagnosis of ET, the prognosis and treatment options. Pertinent laboratory and imaging test  results that were available during this visit with the patient were reviewed by me and considered in my medical decision making (see chart for details).

## 2024-01-10 ENCOUNTER — Encounter: Payer: Self-pay | Admitting: Family Medicine

## 2024-01-16 DIAGNOSIS — I10 Essential (primary) hypertension: Secondary | ICD-10-CM | POA: Diagnosis not present

## 2024-01-16 DIAGNOSIS — M810 Age-related osteoporosis without current pathological fracture: Secondary | ICD-10-CM | POA: Diagnosis not present

## 2024-01-16 DIAGNOSIS — E785 Hyperlipidemia, unspecified: Secondary | ICD-10-CM | POA: Diagnosis not present

## 2024-01-18 ENCOUNTER — Ambulatory Visit

## 2024-01-18 DIAGNOSIS — R195 Other fecal abnormalities: Secondary | ICD-10-CM | POA: Diagnosis not present

## 2024-01-18 DIAGNOSIS — K5730 Diverticulosis of large intestine without perforation or abscess without bleeding: Secondary | ICD-10-CM | POA: Diagnosis not present

## 2024-01-18 DIAGNOSIS — D123 Benign neoplasm of transverse colon: Secondary | ICD-10-CM | POA: Diagnosis not present

## 2024-01-18 DIAGNOSIS — Z09 Encounter for follow-up examination after completed treatment for conditions other than malignant neoplasm: Secondary | ICD-10-CM | POA: Diagnosis not present

## 2024-01-18 DIAGNOSIS — D124 Benign neoplasm of descending colon: Secondary | ICD-10-CM | POA: Diagnosis not present

## 2024-01-18 DIAGNOSIS — Z860101 Personal history of adenomatous and serrated colon polyps: Secondary | ICD-10-CM | POA: Diagnosis not present

## 2024-03-19 ENCOUNTER — Other Ambulatory Visit: Payer: Self-pay | Admitting: Neurology

## 2024-03-19 DIAGNOSIS — G250 Essential tremor: Secondary | ICD-10-CM

## 2024-03-19 DIAGNOSIS — R498 Other voice and resonance disorders: Secondary | ICD-10-CM

## 2024-03-19 MED ORDER — PROPRANOLOL HCL ER 80 MG PO CP24: 80.0000 mg | ORAL_CAPSULE | Freq: Every day | ORAL | 3 refills | Status: AC

## 2024-03-19 NOTE — Telephone Encounter (Signed)
 Pt called to request medication refill propranolol  ER (INDERAL  LA) 60 MG 24 hr capsule   Pt state she would like medication dosage to go back to 80 MG ,Pt states that 60 MG is not helping   Pharmacy  [id number] - [place], [place] - [address] [place] [place] Phone: [phone]  Fax: [phone]

## 2024-03-19 NOTE — Telephone Encounter (Signed)
 Rx for Inderal  LA changed back to 80 mg daily.

## 2024-04-03 ENCOUNTER — Ambulatory Visit

## 2024-04-03 DIAGNOSIS — Z1231 Encounter for screening mammogram for malignant neoplasm of breast: Secondary | ICD-10-CM | POA: Diagnosis not present

## 2024-04-08 ENCOUNTER — Other Ambulatory Visit: Payer: Self-pay | Admitting: Family Medicine

## 2024-04-08 DIAGNOSIS — R928 Other abnormal and inconclusive findings on diagnostic imaging of breast: Secondary | ICD-10-CM

## 2024-04-25 ENCOUNTER — Ambulatory Visit
Admission: RE | Admit: 2024-04-25 | Discharge: 2024-04-25 | Disposition: A | Discharge status: Home / Self Care | Source: Ambulatory Visit | Attending: Family Medicine | Admitting: Family Medicine

## 2024-04-25 ENCOUNTER — Ambulatory Visit

## 2024-04-25 DIAGNOSIS — R928 Other abnormal and inconclusive findings on diagnostic imaging of breast: Secondary | ICD-10-CM

## 2025-01-02 ENCOUNTER — Ambulatory Visit: Admitting: Family Medicine

## 2025-01-13 ENCOUNTER — Ambulatory Visit: Admitting: Family Medicine

## 2025-02-03 ENCOUNTER — Ambulatory Visit: Admitting: Family Medicine
